# Patient Record
Sex: Female | Born: 1960 | Race: White | Hispanic: No | Marital: Married | Smoking: Never smoker
Health system: Southern US, Community
[De-identification: ages and names within clinical notes are randomized; demographics above are authoritative.]

## PROBLEM LIST (undated history)

## (undated) DIAGNOSIS — M797 Fibromyalgia: Secondary | ICD-10-CM

## (undated) DIAGNOSIS — G2581 Restless legs syndrome: Secondary | ICD-10-CM

## (undated) DIAGNOSIS — K219 Gastro-esophageal reflux disease without esophagitis: Secondary | ICD-10-CM

## (undated) HISTORY — PX: INTRAUTERINE DEVICE INSERTION: SHX323

## (undated) HISTORY — PX: HAMMER TOE SURGERY: SHX385

## (undated) HISTORY — PX: KNEE ARTHROSCOPY: SHX127

## (undated) HISTORY — PX: FOOT NEUROMA SURGERY: SHX646

## (undated) HISTORY — PX: TONSILLECTOMY: SUR1361

---

## 2007-01-11 ENCOUNTER — Emergency Department: Payer: Self-pay | Admitting: Emergency Medicine

## 2007-12-29 ENCOUNTER — Ambulatory Visit: Payer: Self-pay | Admitting: Internal Medicine

## 2008-11-19 ENCOUNTER — Ambulatory Visit: Payer: Self-pay | Admitting: Family Medicine

## 2009-04-28 ENCOUNTER — Ambulatory Visit: Payer: Self-pay | Admitting: Internal Medicine

## 2013-05-04 DIAGNOSIS — N939 Abnormal uterine and vaginal bleeding, unspecified: Secondary | ICD-10-CM | POA: Insufficient documentation

## 2013-05-04 DIAGNOSIS — M797 Fibromyalgia: Secondary | ICD-10-CM | POA: Insufficient documentation

## 2013-06-29 ENCOUNTER — Inpatient Hospital Stay: Payer: Self-pay | Admitting: Internal Medicine

## 2013-06-29 LAB — BASIC METABOLIC PANEL
Anion Gap: 5 — ABNORMAL LOW (ref 7–16)
BUN: 17 mg/dL (ref 7–18)
Calcium, Total: 9 mg/dL (ref 8.5–10.1)
Creatinine: 0.99 mg/dL (ref 0.60–1.30)
EGFR (African American): >60
EGFR (Non-African Amer.): >60
Sodium: 135 mmol/L — ABNORMAL LOW (ref 136–145)

## 2013-06-29 LAB — CBC
HGB: 10.9 g/dL — ABNORMAL LOW (ref 12.0–16.0)
MCH: 27.1 pg (ref 26.0–34.0)
MCV: 83 fL (ref 80–100)
Platelet: 182 10*3/uL (ref 150–440)
RDW: 15.7 % — ABNORMAL HIGH (ref 11.5–14.5)
WBC: 14.3 10*3/uL — ABNORMAL HIGH (ref 3.6–11.0)

## 2013-06-29 LAB — CK TOTAL AND CKMB (NOT AT ARMC): CK, Total: 291 U/L — ABNORMAL HIGH (ref 21–215)

## 2013-06-30 LAB — BASIC METABOLIC PANEL
Anion Gap: 7 (ref 7–16)
Co2: 27 mmol/L (ref 21–32)
Creatinine: 0.91 mg/dL (ref 0.60–1.30)
EGFR (African American): >60
EGFR (Non-African Amer.): >60
Osmolality: 280 (ref 275–301)
Potassium: 4.2 mmol/L (ref 3.5–5.1)
Sodium: 138 mmol/L (ref 136–145)

## 2013-06-30 LAB — CBC WITH DIFFERENTIAL/PLATELET
Basophil %: 0.1 %
Eosinophil #: 0 10*3/uL (ref 0.0–0.7)
Eosinophil %: 0 %
HCT: 32.7 % — ABNORMAL LOW (ref 35.0–47.0)
HGB: 10.9 g/dL — ABNORMAL LOW (ref 12.0–16.0)
Lymphocyte #: 2 10*3/uL (ref 1.0–3.6)
MCH: 27.6 pg (ref 26.0–34.0)
MCHC: 33.4 g/dL (ref 32.0–36.0)
Monocyte #: 0.6 x10 3/mm (ref 0.2–0.9)
Monocyte %: 4.6 %
Neutrophil %: 81.2 %
RDW: 16.1 % — ABNORMAL HIGH (ref 11.5–14.5)
WBC: 14.1 10*3/uL — ABNORMAL HIGH (ref 3.6–11.0)

## 2013-07-01 LAB — CBC WITH DIFFERENTIAL/PLATELET
Bands: 7 %
HCT: 35.1 % (ref 35.0–47.0)
HGB: 11.4 g/dL — ABNORMAL LOW (ref 12.0–16.0)
Lymphocytes: 23 %
MCH: 26.9 pg (ref 26.0–34.0)
MCHC: 32.5 g/dL (ref 32.0–36.0)
Monocytes: 6 %
RBC: 4.26 10*6/uL (ref 3.80–5.20)

## 2013-07-02 DIAGNOSIS — I509 Heart failure, unspecified: Secondary | ICD-10-CM

## 2015-03-23 ENCOUNTER — Ambulatory Visit
Admit: 2015-03-23 | Disposition: A | Discharge status: Home / Self Care | Payer: Self-pay | Attending: Specialist | Admitting: Specialist

## 2015-03-23 LAB — HCG, QUANTITATIVE, PREGNANCY: BETA HCG, QUANT.: 4 m[IU]/mL

## 2015-03-24 ENCOUNTER — Ambulatory Visit
Admit: 2015-03-24 | Disposition: A | Discharge status: Home / Self Care | Payer: Self-pay | Attending: Internal Medicine | Admitting: Internal Medicine

## 2015-03-31 ENCOUNTER — Ambulatory Visit: Admit: 2015-03-31 | Disposition: A | Discharge status: Home / Self Care | Payer: Self-pay | Admitting: Specialist

## 2015-04-08 NOTE — Discharge Summary (Signed)
PATIENT NAME:  SYBOL, MORRE MR#:  [id number] DATE OF BIRTH:  September 18, 1961  DATE OF ADMISSION:  06/29/2013 DATE OF DISCHARGE:  07/03/2013  DISCHARGE DIAGNOSES:   1.  Acute respiratory failure secondary to pneumonia.  2.  Moderate obesity.  3.  Fibromyalgia.  4.HTN  DISCHARGE MEDICATIONS: Levaquin 750 mg p.o. daily, prednisone 20 mg 2 tablets for 2 days, 1 tablet daily for 2 days and then stop, ibuprofen 800 mg p.o. b.i.d., Cymbalta 20 mg p.o. b.i.d., Norco 5/325 tablet every 4 hours, metoprolol 25 mg p.o. b.i.d., omeprazole 20 mg p.o. daily, Lyrica  20 mg p.o. b.i.d., Requip  2 mg half tablet daily.   DIET:  Low-sodium diet.  Levaquin 750 mg for 6 days.   HOSPITAL COURSE: A 54 year old female patient who is mildly obese with fibromyalgia came in because of shortness of breath and cough. The patient had low grade temperature at home, oxygen saturation was 91% on room airin ER.Marland Kitchen Had fever in the ER 102.7, admitted to hospitalist service for pneumonia and acute respiratory failure. Chest x-ray showed right middle lobe pneumonia. She was started on IV Levaquin along with Solu-Medrol, nebulizers, oxygen and Tussionex for cough. The patient's blood cultures are negative to.   She was started on IV fluids also, her symptoms resolved. The patient cough got better and remained afebrile. She did have some shortness of breath. Repeat x-ray showed some fluid overload. Echocardiogram was done, which showed ejection fraction more than 55% with normal LV function. The patient IV fluids were stopped and a dose of  Lasix was given. The patient's oxygen saturation improved to 93% on room air, so we will discharge her home with antibiotics and steroids. The patient's blood pressure stayed around 150/90, 160/80, so we started the metoprolol 25 mg p.o. b.i.d. The patient advised to follow up with her physician in the [place]: More than 30 minutes.   ____________________________ Epifanio Lesches, MD sk:cc D: 07/13/2013 14:36:32 ET T: 07/13/2013 15:19:54 ET JOB#: [id number]  cc: Epifanio Lesches, MD, <Dictator> Epifanio Lesches MD ELECTRONICALLY SIGNED 07/13/2013 19:25

## 2015-04-08 NOTE — H&P (Signed)
PATIENT NAME:  Deborah Marquez, Deborah Marquez MR#:  [id number] DATE OF BIRTH:  05-Dec-1961  DATE OF ADMISSION:  06/29/2013  PRIMARY CARE PHYSICIAN:  From [place].  CHIEF COMPLAINT: Shortness of breath and cough.   HISTORY OF PRESENT ILLNESS: The patient is a 54 year old female patient with history of fibromyalgia who came in last night because of cough and shortness of breath getting progressively worse for the past 1 week. Initially started as a sore throat a week ago and then over the last week the patient developed some cough and also generalized body aches and feeling poor, and the patient's cough is mainly nonproductive. Then shortness of breath got worse yesterday.  She was seen in the Emergency Room and she was about to be discharged, but she was hypoxic on room air, sats were only 91% on room, so she was being admitted for pneumonia and respiratory failure. The was also noted to have low-grade fever at home. No nausea. No vomiting. No diarrhea. No abdominal pain. No sick contact. No recent travel. The patient felt lightheaded yesterday.  No extremity edema. No orthopnea or PND.  In the ER, the patient's maximum fever was 102.7.  PAST MEDICAL HISTORY:  Significant for history of fibromyalgia.   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   ALLERGIES: PENICILLIN, SULFA AND ASPIRIN.   SOCIAL HISTORY: No smoking.  No alcohol. No drugs.   FAMILY HISTORY: No hypertension or diabetes.   MEDICATIONS:  1.  Albuterol that was given last night.  2.  Flexeril 10 mg p.o. as needed. 3.  Cymbalta 20 mg delayed release capsule 2 capsules daily. 4.  Ibuprofen 800 mg p.o. b.i.d.  5.  Lyrica 25 mg p.o. b.i.d.  6.  Omeprazole 20 mg p.o. daily.  7.  Requip 2 mg 1/2 tablets once a day.   REVIEW OF SYSTEMS:    CONSTITUTIONAL: Has fever, fatigue and weakness. EYES: No blurred vision.  ENT: No tinnitus. No epistaxis. The patient had some  sore throat and difficulty swallowing in earlier part of the week. RESPIRATORY:  Does have some cough, unable to get the phlegm out. Also has trouble breathing for almost 3 to 4 days, getting worse.  CARDIOVASCULAR: No chest pain. No orthopnea. No PND.  No pedal edema.  GASTROINTESTINAL: Has no nausea, no vomiting, no abdominal pain. The patient has poor appetite.  GENITOURINARY: No dysuria.  ENDOCRINE: No polyuria or nocturia.  HEMATOLOGIC: No anemia or easy bruising.  INTEGUMENTARY: No skin rashes.  MUSCULOSKELETAL: Has fibromyalgia, but denies any acute joint pains at this time.  NEUROLOGIC: No numbness. No weakness. No dysarthria. The patient does have some headache because of the cough.  PSYCHIATRIC: No anxiety. Has insomnia.   PHYSICAL EXAMINATION: VITAL SIGNS: Temperature 98.8, heart rate is around 120s to 130s. During my exam it is around 110.  Blood pressure 110/76 and O2 sats 91% on room air. GENERAL: An alert, awake, oriented and mildly obese female answering questions appropriately.  The patient is not in distress, but is coughing, having very dry cough. HEAD:  Normocephalic, atraumatic.  EYES:  PERRLA. EOMs intact. No conjunctival pallor. No scleral icterus.  NOSE: No nasal lesions. No drainage.  EARS: No tympanic membrane congestion. No external lesions.  MOUTH:  Has no lesions. No exudates. Tonsils are slightly enlarged.  NECK: Supple, symmetric. No masses. Thyroid is in the midline.  LUNGS:  The patient has good respiratory effort.  Mostly clear to  auscultation with slight crackles at the left base.  HEART:  S1 and S2 regular. No murmurs. No gallops. PMI not displaced. Pulses are equal, carotid, femoral and pedal.  ABDOMEN:  Soft, nontender and nondistended. No hepatosplenomegaly. No CVA tenderness.  MUSCULOSKELETAL: The patient has no pathology at the digits or hips.  Normal range of motion. Strength and tone are equal bilaterally.  SKIN: Normal, well-hydrated. No  diaphoresis. LYMPH:  No cervical lymph nodes, no lymphadenopathy.  NEUROLOGIC: Cranial nerves II through XII are intact. DTRs are 2+ bilaterally. Motor exam is normal with strength 4 x 4 in bilateral upper and lower extremities. PSYCHIATRIC: Judgment and insight are adequate, alert and oriented x 3.   LABORATORY AND DIAGNOSTICS: Chest x-ray showed focal heterogenous opacity in the middle of right lower lobe. Maybe due to pneumonia.   WBC 14.3, hemoglobin 10.9, hematocrit 33.5 and platelets 182. Electrolytes:  Sodium 135, potassium 3.5, chloride 102, bicarb 28, BUN 17, creatinine 0.99 and glucose 120. Troponin less than 0.02. CK total 291.   EKG showed sinus tachycardia at 116 beats per minute. No ST-T changes.   ASSESSMENT AND PLAN:   1.  The patient is a 54 year old female patient with respiratory failure secondary to pneumonia. The patient's O2 sats are around 91 on room air and 95% on 2 liters. Admit to hospitalist service. Continue oxygen, 2 liters, to keep sats more than 95. Continue Levaquin and also Solu-Medrol. Add nebulizer every 4 hours p.r.n. The patient will be monitored for temperature.  Sputum and blood cultures are already sent so please follow. Add Mucinex for mucolytic action.  2.  Fibromyalgia. She is on Cymbalta and Requip.  Continue those medications.  3.  Tachycardia secondary to respiratory status. She was started on small dose beta blocker and also we are going to start fluids.  4.  Gastrointestinal prophylaxis and deep vein thrombosis prophylaxis.   I discussed the plan with the patient.  TIME SPENT: About  60 minutes on this patient.   ____________________________ Epifanio Lesches, MD sk:sb D: 06/29/2013 09:52:05 ET T: 06/29/2013 10:06:56 ET JOB#: [id number]  cc: Epifanio Lesches, MD, <Dictator> Epifanio Lesches MD ELECTRONICALLY SIGNED 06/29/2013 20:21

## 2015-04-20 ENCOUNTER — Ambulatory Visit: Payer: BLUE CROSS/BLUE SHIELD | Attending: Otolaryngology

## 2015-04-20 DIAGNOSIS — F5101 Primary insomnia: Secondary | ICD-10-CM | POA: Diagnosis not present

## 2015-04-20 DIAGNOSIS — I4891 Unspecified atrial fibrillation: Secondary | ICD-10-CM | POA: Diagnosis present

## 2015-04-20 DIAGNOSIS — G4733 Obstructive sleep apnea (adult) (pediatric): Secondary | ICD-10-CM | POA: Diagnosis not present

## 2015-08-16 ENCOUNTER — Encounter
Admission: RE | Admit: 2015-08-16 | Discharge: 2015-08-16 | Disposition: A | Discharge status: Home / Self Care | Payer: BLUE CROSS/BLUE SHIELD | Source: Ambulatory Visit | Attending: Specialist | Admitting: Specialist

## 2015-08-16 DIAGNOSIS — Z01818 Encounter for other preprocedural examination: Secondary | ICD-10-CM | POA: Diagnosis present

## 2015-08-16 HISTORY — DX: Fibromyalgia: M79.7

## 2015-08-16 HISTORY — DX: Gastro-esophageal reflux disease without esophagitis: K21.9

## 2015-08-16 LAB — BASIC METABOLIC PANEL
Anion gap: 6 (ref 5–15)
BUN: 20 mg/dL (ref 6–20)
CO2: 31 mmol/L (ref 22–32)
Calcium: 9.3 mg/dL (ref 8.9–10.3)
Chloride: 103 mmol/L (ref 101–111)
Creatinine, Ser: 0.68 mg/dL (ref 0.44–1.00)
GFR calc Af Amer: >60 mL/min (ref >60)
GFR calc non Af Amer: >60 mL/min (ref >60)
Glucose, Bld: 91 mg/dL (ref 65–99)
Potassium: 4.3 mmol/L (ref 3.5–5.1)
Sodium: 140 mmol/L (ref 135–145)

## 2015-08-16 LAB — TYPE AND SCREEN
ABO/RH(D): O NEG
Antibody Screen: NEGATIVE

## 2015-08-16 LAB — ABO/RH: ABO/RH(D): O NEG

## 2015-08-16 NOTE — Patient Instructions (Addendum)
  Your procedure is scheduled on: Tuesday 08/23/2015 Report to Day Surgery. 2ND FLOOR MEDICAL MALL ENTRANCE To find out your arrival time please call [phone] between 1PM - 3PM on Friday 08/19/2015.  Remember: Instructions that are not followed completely may result in serious medical risk, up to and including death, or upon the discretion of your surgeon and anesthesiologist your surgery may need to be rescheduled.    __X__ 1. Do not eat food or drink liquids after midnight. No gum chewing or hard candies.     __X__ 2. No Alcohol for 24 hours before or after surgery.   ____ 3. Bring all medications with you on the day of surgery if instructed.    __X__ 4. Notify your doctor if there is any change in your medical condition     (cold, fever, infections).     Do not wear jewelry, make-up, hairpins, clips or nail polish.  Do not wear lotions, powders, or perfumes.   Do not shave 48 hours prior to surgery. Men may shave face and neck.  Do not bring valuables to the hospital.    [facility] is not responsible for any belongings or valuables.               Contacts, dentures or bridgework may not be worn into surgery.  Leave your suitcase in the car. After surgery it may be brought to your room.  For patients admitted to the hospital, discharge time is determined by your                treatment team.   Patients discharged the day of surgery will not be allowed to drive home.   Please read over the following fact sheets that you were given:   Surgical Site Infection Prevention   __X__ Take these medicines the morning of surgery with A SIP OF WATER:    1. CYMBALTA  2. LYRICA  3. OMEPRAZOLE  4.  5.  6.  ____ Fleet Enema (as directed)   __X__ Use CHG Soap as directed  ____ Use inhalers on the day of surgery  ____ Stop metformin 2 days prior to surgery    ____ Take 1/2 of usual insulin dose the night before surgery and none on the morning of surgery.   ____ Stop  Coumadin/Plavix/aspirin on   __X__ Stop Anti-inflammatories on TODAY 08/16/2015   ____ Stop supplements until after surgery.    __X__ Bring C-Pap to the hospital.

## 2015-08-23 ENCOUNTER — Inpatient Hospital Stay: Payer: BLUE CROSS/BLUE SHIELD | Admitting: Anesthesiology

## 2015-08-23 ENCOUNTER — Encounter: Payer: Self-pay | Admitting: Anesthesiology

## 2015-08-23 ENCOUNTER — Encounter
Admission: RE | Disposition: A | Discharge status: Home / Self Care | Payer: Self-pay | Source: Ambulatory Visit | Attending: Specialist

## 2015-08-23 ENCOUNTER — Inpatient Hospital Stay
Admission: RE | Admit: 2015-08-23 | Discharge: 2015-08-24 | DRG: 620 | Disposition: A | Discharge status: Home / Self Care | Payer: BLUE CROSS/BLUE SHIELD | Source: Ambulatory Visit | Attending: Specialist | Admitting: Specialist

## 2015-08-23 DIAGNOSIS — Z8701 Personal history of pneumonia (recurrent): Secondary | ICD-10-CM | POA: Diagnosis not present

## 2015-08-23 DIAGNOSIS — M940 Chondrocostal junction syndrome [Tietze]: Secondary | ICD-10-CM | POA: Diagnosis present

## 2015-08-23 DIAGNOSIS — M159 Polyosteoarthritis, unspecified: Secondary | ICD-10-CM | POA: Diagnosis present

## 2015-08-23 DIAGNOSIS — G2581 Restless legs syndrome: Secondary | ICD-10-CM | POA: Diagnosis present

## 2015-08-23 DIAGNOSIS — Z6841 Body Mass Index (BMI) 40.0 and over, adult: Secondary | ICD-10-CM

## 2015-08-23 DIAGNOSIS — K9589 Other complications of other bariatric procedure: Secondary | ICD-10-CM | POA: Diagnosis not present

## 2015-08-23 DIAGNOSIS — G43909 Migraine, unspecified, not intractable, without status migrainosus: Secondary | ICD-10-CM | POA: Diagnosis present

## 2015-08-23 DIAGNOSIS — M797 Fibromyalgia: Secondary | ICD-10-CM | POA: Diagnosis present

## 2015-08-23 DIAGNOSIS — Z880 Allergy status to penicillin: Secondary | ICD-10-CM | POA: Diagnosis not present

## 2015-08-23 DIAGNOSIS — Z79891 Long term (current) use of opiate analgesic: Secondary | ICD-10-CM

## 2015-08-23 DIAGNOSIS — Z791 Long term (current) use of non-steroidal anti-inflammatories (NSAID): Secondary | ICD-10-CM | POA: Diagnosis not present

## 2015-08-23 DIAGNOSIS — K219 Gastro-esophageal reflux disease without esophagitis: Secondary | ICD-10-CM | POA: Diagnosis present

## 2015-08-23 DIAGNOSIS — K449 Diaphragmatic hernia without obstruction or gangrene: Secondary | ICD-10-CM | POA: Diagnosis present

## 2015-08-23 DIAGNOSIS — Z8262 Family history of osteoporosis: Secondary | ICD-10-CM | POA: Diagnosis not present

## 2015-08-23 DIAGNOSIS — Z882 Allergy status to sulfonamides status: Secondary | ICD-10-CM

## 2015-08-23 DIAGNOSIS — I7300 Raynaud's syndrome without gangrene: Secondary | ICD-10-CM | POA: Diagnosis present

## 2015-08-23 DIAGNOSIS — Z8249 Family history of ischemic heart disease and other diseases of the circulatory system: Secondary | ICD-10-CM | POA: Diagnosis not present

## 2015-08-23 DIAGNOSIS — G4733 Obstructive sleep apnea (adult) (pediatric): Secondary | ICD-10-CM | POA: Diagnosis present

## 2015-08-23 DIAGNOSIS — J309 Allergic rhinitis, unspecified: Secondary | ICD-10-CM | POA: Diagnosis present

## 2015-08-23 DIAGNOSIS — Z780 Asymptomatic menopausal state: Secondary | ICD-10-CM

## 2015-08-23 DIAGNOSIS — Z833 Family history of diabetes mellitus: Secondary | ICD-10-CM | POA: Diagnosis not present

## 2015-08-23 DIAGNOSIS — K9171 Accidental puncture and laceration of a digestive system organ or structure during a digestive system procedure: Secondary | ICD-10-CM | POA: Diagnosis not present

## 2015-08-23 DIAGNOSIS — Z79899 Other long term (current) drug therapy: Secondary | ICD-10-CM | POA: Diagnosis not present

## 2015-08-23 HISTORY — PX: LAPAROSCOPIC GASTRIC RESTRICTIVE DUODENAL PROCEDURE (DUODENAL SWITCH): SHX6667

## 2015-08-23 LAB — CBC
HCT: 40.1 % (ref 35.0–47.0)
HEMOGLOBIN: 13 g/dL (ref 12.0–16.0)
MCH: 28 pg (ref 26.0–34.0)
MCHC: 32.5 g/dL (ref 32.0–36.0)
MCV: 86.1 fL (ref 80.0–100.0)
Platelets: 56 10*3/uL — ABNORMAL LOW (ref 150–440)
RBC: 4.66 MIL/uL (ref 3.80–5.20)
RDW: 15.2 % — ABNORMAL HIGH (ref 11.5–14.5)
WBC: 12.8 10*3/uL — AB (ref 3.6–11.0)

## 2015-08-23 LAB — CREATININE, SERUM: CREATININE: 0.75 mg/dL (ref 0.44–1.00)

## 2015-08-23 SURGERY — LAPAROSCOPIC GASTRIC RESTRICTIVE DUODENAL PROCEDURE (DUODENAL SWITCH)
Anesthesia: General | Wound class: Clean Contaminated

## 2015-08-23 MED ORDER — OXYCODONE HCL 5 MG/5ML PO SOLN: 5.0000 mg | Freq: Once | ORAL | Status: DC | PRN

## 2015-08-23 MED ORDER — SUGAMMADEX SODIUM 500 MG/5ML IV SOLN
INTRAVENOUS | Status: DC | PRN
  Administered 2015-08-23: 200 mg via INTRAVENOUS

## 2015-08-23 MED ORDER — SCOPOLAMINE 1 MG/3DAYS TD PT72
1.0000 | MEDICATED_PATCH | TRANSDERMAL | Status: DC
  Administered 2015-08-23: 1.5 mg via TRANSDERMAL

## 2015-08-23 MED ORDER — OXYCODONE HCL 5 MG PO TABS: 5.0000 mg | ORAL_TABLET | Freq: Once | ORAL | Status: DC | PRN

## 2015-08-23 MED ORDER — ACETAMINOPHEN 10 MG/ML IV SOLN
INTRAVENOUS | Status: AC
  Administered 2015-08-23: 1000 mg via INTRAVENOUS
  Filled 2015-08-23: qty 100

## 2015-08-23 MED ORDER — ONDANSETRON HCL 4 MG/2ML IJ SOLN
INTRAMUSCULAR | Status: DC | PRN
  Administered 2015-08-23: 4 mg via INTRAVENOUS

## 2015-08-23 MED ORDER — DEXAMETHASONE SODIUM PHOSPHATE 4 MG/ML IJ SOLN
INTRAMUSCULAR | Status: DC | PRN
  Administered 2015-08-23: 5 mg via INTRAVENOUS

## 2015-08-23 MED ORDER — PROPOFOL 10 MG/ML IV BOLUS
INTRAVENOUS | Status: DC | PRN
  Administered 2015-08-23: 200 mg via INTRAVENOUS

## 2015-08-23 MED ORDER — CLINDAMYCIN PHOSPHATE 600 MG/50ML IV SOLN
600.0000 mg | Freq: Three times a day (TID) | INTRAVENOUS | Status: AC
  Administered 2015-08-23 – 2015-08-24 (×2): 600 mg via INTRAVENOUS
  Filled 2015-08-23 (×2): qty 50

## 2015-08-23 MED ORDER — FENTANYL CITRATE (PF) 100 MCG/2ML IJ SOLN
INTRAMUSCULAR | Status: AC
  Filled 2015-08-23: qty 2

## 2015-08-23 MED ORDER — LIDOCAINE HCL (CARDIAC) 20 MG/ML IV SOLN
INTRAVENOUS | Status: DC | PRN
  Administered 2015-08-23: 100 mg via INTRAVENOUS

## 2015-08-23 MED ORDER — ENOXAPARIN SODIUM 40 MG/0.4ML ~~LOC~~ SOLN
40.0000 mg | Freq: Two times a day (BID) | SUBCUTANEOUS | Status: DC
  Administered 2015-08-24: 40 mg via SUBCUTANEOUS
  Filled 2015-08-23: qty 0.4

## 2015-08-23 MED ORDER — FENTANYL CITRATE (PF) 100 MCG/2ML IJ SOLN
INTRAMUSCULAR | Status: DC | PRN
  Administered 2015-08-23 (×2): 50 ug via INTRAVENOUS
  Administered 2015-08-23: 250 ug via INTRAVENOUS
  Administered 2015-08-23: 100 ug via INTRAVENOUS
  Administered 2015-08-23: 50 ug via INTRAVENOUS

## 2015-08-23 MED ORDER — INFLUENZA VAC SPLIT QUAD 0.5 ML IM SUSY
0.5000 mL | PREFILLED_SYRINGE | INTRAMUSCULAR | Status: AC
  Administered 2015-08-24: 0.5 mL via INTRAMUSCULAR
  Filled 2015-08-23: qty 0.5

## 2015-08-23 MED ORDER — FENTANYL CITRATE (PF) 100 MCG/2ML IJ SOLN
25.0000 ug | INTRAMUSCULAR | Status: AC | PRN
  Administered 2015-08-23 (×6): 25 ug via INTRAVENOUS

## 2015-08-23 MED ORDER — THROMBIN 5000 UNITS EX SOLR
CUTANEOUS | Status: AC
  Filled 2015-08-23: qty 5000

## 2015-08-23 MED ORDER — ONDANSETRON HCL 4 MG/2ML IJ SOLN
4.0000 mg | INTRAMUSCULAR | Status: DC | PRN
  Administered 2015-08-23 – 2015-08-24 (×2): 4 mg via INTRAVENOUS
  Filled 2015-08-23 (×2): qty 2

## 2015-08-23 MED ORDER — MORPHINE SULFATE (PF) 2 MG/ML IV SOLN
2.0000 mg | INTRAVENOUS | Status: DC | PRN
  Administered 2015-08-23: 6 mg via INTRAVENOUS
  Administered 2015-08-23: 4 mg via INTRAVENOUS
  Filled 2015-08-23: qty 3
  Filled 2015-08-23: qty 2

## 2015-08-23 MED ORDER — SCOPOLAMINE 1 MG/3DAYS TD PT72
MEDICATED_PATCH | TRANSDERMAL | Status: AC
  Administered 2015-08-23: 1.5 mg via TRANSDERMAL
  Filled 2015-08-23: qty 1

## 2015-08-23 MED ORDER — OXYCODONE HCL 5 MG/5ML PO SOLN: 5.0000 mg | ORAL | Status: DC | PRN

## 2015-08-23 MED ORDER — THROMBIN 5000 UNITS EX SOLR
CUTANEOUS | Status: DC | PRN
  Administered 2015-08-23: 5000 [IU] via TOPICAL

## 2015-08-23 MED ORDER — SODIUM CHLORIDE 0.9 % IV SOLN
INTRAVENOUS | Status: DC
  Administered 2015-08-23 – 2015-08-24 (×2): via INTRAVENOUS

## 2015-08-23 MED ORDER — MIDAZOLAM HCL 2 MG/2ML IJ SOLN
INTRAMUSCULAR | Status: DC | PRN
  Administered 2015-08-23: 2 mg via INTRAVENOUS

## 2015-08-23 MED ORDER — CLINDAMYCIN PHOSPHATE 900 MG/50ML IV SOLN
INTRAVENOUS | Status: AC
  Administered 2015-08-23: 900 mg via INTRAVENOUS
  Filled 2015-08-23: qty 50

## 2015-08-23 MED ORDER — PHENYLEPHRINE HCL 10 MG/ML IJ SOLN
INTRAMUSCULAR | Status: DC | PRN
  Administered 2015-08-23 (×2): 200 ug via INTRAVENOUS

## 2015-08-23 MED ORDER — SUCCINYLCHOLINE CHLORIDE 20 MG/ML IJ SOLN
INTRAMUSCULAR | Status: DC | PRN
  Administered 2015-08-23: 120 mg via INTRAVENOUS

## 2015-08-23 MED ORDER — GENTAMICIN SULFATE 40 MG/ML IJ SOLN
1.5000 mg/kg | Freq: Once | INTRAMUSCULAR | Status: DC
  Filled 2015-08-23: qty 5.75

## 2015-08-23 MED ORDER — ACETAMINOPHEN 160 MG/5ML PO SOLN
325.0000 mg | ORAL | Status: DC | PRN
Start: 2015-08-24 — End: 2015-08-24
  Filled 2015-08-23: qty 20.3

## 2015-08-23 MED ORDER — ENOXAPARIN SODIUM 40 MG/0.4ML ~~LOC~~ SOLN: 40.0000 mg | Freq: Two times a day (BID) | SUBCUTANEOUS | Status: DC

## 2015-08-23 MED ORDER — SODIUM CHLORIDE 0.9 % IR SOLN
Status: DC | PRN
  Administered 2015-08-23: 200 mL

## 2015-08-23 MED ORDER — ACETAMINOPHEN 10 MG/ML IV SOLN
1000.0000 mg | Freq: Four times a day (QID) | INTRAVENOUS | Status: DC
  Administered 2015-08-23 – 2015-08-24 (×3): 1000 mg via INTRAVENOUS
  Filled 2015-08-23 (×4): qty 100

## 2015-08-23 MED ORDER — HYDROCODONE-ACETAMINOPHEN 7.5-325 MG/15ML PO SOLN: 15.0000 mL | Freq: Four times a day (QID) | ORAL | Status: AC | PRN

## 2015-08-23 MED ORDER — ACETAMINOPHEN 10 MG/ML IV SOLN: 1000.0000 mg | Freq: Once | INTRAVENOUS | Status: DC

## 2015-08-23 MED ORDER — BUPIVACAINE-EPINEPHRINE (PF) 0.5% -1:200000 IJ SOLN
INTRAMUSCULAR | Status: AC
  Filled 2015-08-23: qty 30

## 2015-08-23 MED ORDER — LACTATED RINGERS IV SOLN
INTRAVENOUS | Status: DC
  Administered 2015-08-23 (×2): via INTRAVENOUS

## 2015-08-23 MED ORDER — BUPIVACAINE-EPINEPHRINE (PF) 0.5% -1:200000 IJ SOLN
INTRAMUSCULAR | Status: DC | PRN
  Administered 2015-08-23: 25 mL

## 2015-08-23 MED ORDER — LIDOCAINE-EPINEPHRINE (PF) 1 %-1:200000 IJ SOLN
INTRAMUSCULAR | Status: AC
  Filled 2015-08-23: qty 30

## 2015-08-23 MED ORDER — LACTATED RINGERS IV SOLN
INTRAVENOUS | Status: DC
Start: 2015-08-23 — End: 2015-08-23
  Administered 2015-08-23: 13:00:00 via INTRAVENOUS

## 2015-08-23 MED ORDER — PANTOPRAZOLE SODIUM 40 MG IV SOLR
40.0000 mg | Freq: Every day | INTRAVENOUS | Status: DC
  Administered 2015-08-23: 40 mg via INTRAVENOUS
  Filled 2015-08-23: qty 40

## 2015-08-23 MED ORDER — CLINDAMYCIN PHOSPHATE 900 MG/50ML IV SOLN
900.0000 mg | Freq: Once | INTRAVENOUS | Status: AC
  Administered 2015-08-23: 900 mg via INTRAVENOUS

## 2015-08-23 MED ORDER — ROCURONIUM BROMIDE 100 MG/10ML IV SOLN
INTRAVENOUS | Status: DC | PRN
  Administered 2015-08-23: 20 mg via INTRAVENOUS
  Administered 2015-08-23: 10 mg via INTRAVENOUS
  Administered 2015-08-23: 50 mg via INTRAVENOUS

## 2015-08-23 MED ORDER — LIDOCAINE-EPINEPHRINE (PF) 1 %-1:200000 IJ SOLN
INTRAMUSCULAR | Status: DC | PRN
  Administered 2015-08-23: 15 mL

## 2015-08-23 MED ORDER — ONDANSETRON HCL 4 MG PO TABS: 4.0000 mg | ORAL_TABLET | Freq: Four times a day (QID) | ORAL | Status: DC | PRN

## 2015-08-23 SURGICAL SUPPLY — 68 items
APPLICATOR SURGIFLO (MISCELLANEOUS) ×4 IMPLANT
APPLIER CLIP ROT 13.4 12 LRG (CLIP)
BANDAGE ELASTIC 6 CLIP NS LF (GAUZE/BANDAGES/DRESSINGS) ×8 IMPLANT
BLADE SURG SZ11 CARB STEEL (BLADE) ×4 IMPLANT
BULB RESERV EVAC DRAIN JP 100C (MISCELLANEOUS) ×4 IMPLANT
CANISTER SUCT 1200ML W/VALVE (MISCELLANEOUS) ×4 IMPLANT
CHLORAPREP W/TINT 26ML (MISCELLANEOUS) ×8 IMPLANT
CLIP APPLIE ROT 13.4 12 LRG (CLIP) IMPLANT
DEFOGGER SCOPE WARMER CLEARIFY (MISCELLANEOUS) ×4 IMPLANT
DRAIN CHANNEL JP 19F (MISCELLANEOUS) ×4 IMPLANT
DRAPE UTILITY 15X26 TOWEL STRL (DRAPES) ×8 IMPLANT
DRSG TEGADERM 4X4.75 (GAUZE/BANDAGES/DRESSINGS) ×4 IMPLANT
FILTER LAP SMOKE EVAC STRL (MISCELLANEOUS) ×4 IMPLANT
GLOVE BIO SURGEON STRL SZ 6.5 (GLOVE) ×6 IMPLANT
GLOVE BIO SURGEON STRL SZ8 (GLOVE) IMPLANT
GLOVE BIO SURGEONS STRL SZ 6.5 (GLOVE) ×2
GLOVE SKINSENSE NS SZ7.5 (GLOVE) ×18
GLOVE SKINSENSE STRL SZ7.5 (GLOVE) ×18 IMPLANT
GOWN STRL REUS W/ TWL LRG LVL3 (GOWN DISPOSABLE) ×10 IMPLANT
GOWN STRL REUS W/ TWL XL LVL3 (GOWN DISPOSABLE) ×2 IMPLANT
GOWN STRL REUS W/TWL LRG LVL3 (GOWN DISPOSABLE) ×10
GOWN STRL REUS W/TWL XL LVL3 (GOWN DISPOSABLE) ×2
HEMOSTAT SURGICEL 2X3 (HEMOSTASIS) ×4 IMPLANT
IRRIGATION STRYKERFLOW (MISCELLANEOUS) ×2 IMPLANT
IRRIGATOR STRYKERFLOW (MISCELLANEOUS) ×4
IV NS 1000ML (IV SOLUTION) ×2
IV NS 1000ML BAXH (IV SOLUTION) ×2 IMPLANT
KIT RM TURNOVER STRD PROC AR (KITS) ×4 IMPLANT
LABEL OR SOLS (LABEL) ×4 IMPLANT
LIQUID BAND (GAUZE/BANDAGES/DRESSINGS) ×4 IMPLANT
NDL INSUFF 14G 150MM VS150000 (NEEDLE) ×4 IMPLANT
NDL SAFETY 22GX1.5 (NEEDLE) ×4 IMPLANT
NS IRRIG 1000ML POUR BTL (IV SOLUTION) ×4 IMPLANT
PACK LAP CHOLECYSTECTOMY (MISCELLANEOUS) ×4 IMPLANT
RELOAD STAPLER BLUE 60MM (STAPLE) ×2 IMPLANT
RELOAD STAPLER GOLD 60MM (STAPLE) ×10 IMPLANT
RELOAD STAPLER GREEN 60MM (STAPLE) ×2 IMPLANT
RELOAD STAPLER WHITE 60MM (STAPLE) ×4 IMPLANT
SHEARS HARMONIC ACE PLUS 45CM (MISCELLANEOUS) ×4 IMPLANT
SLEEVE ENDOPATH XCEL 5M (ENDOMECHANICALS) ×8 IMPLANT
SLEEVE GASTRECTOMY 40FR VISIGI (MISCELLANEOUS) ×4 IMPLANT
SPOGE SURGIFLO 8M (HEMOSTASIS) ×2
SPONGE DRAIN TRACH 4X4 STRL 2S (GAUZE/BANDAGES/DRESSINGS) ×4 IMPLANT
SPONGE SURGIFLO 8M (HEMOSTASIS) ×2 IMPLANT
STAPLER ECHELON BIOABSB 60 FLE (MISCELLANEOUS) ×28 IMPLANT
STAPLER ECHELON LONG 60 440 (INSTRUMENTS) ×4 IMPLANT
STAPLER RELOAD BLUE 60MM (STAPLE) ×4
STAPLER RELOAD GOLD 60MM (STAPLE) ×20
STAPLER RELOAD GREEN 60MM (STAPLE) ×4
STAPLER RELOAD WHITE 60MM (STAPLE) ×8
SUT DEVICE BRAIDED 0X39 (SUTURE) ×4 IMPLANT
SUT DEVICE BRAIDED 2.0X39 (SUTURE) ×4 IMPLANT
SUT DVC ABSORB BRAID 3.0X39 (SUTURE) ×16 IMPLANT
SUT DVC VICRYL PGA 2.0X39 (SUTURE) ×4 IMPLANT
SUT ETHILON 2 0 FS 18 (SUTURE) ×4 IMPLANT
SUT MNCRL 4-0 (SUTURE) ×4
SUT MNCRL 4-0 27XMFL (SUTURE) ×4
SUT VIC AB 4-0 FS2 27 (SUTURE) ×8 IMPLANT
SUT VICRYL/POLYSORB 3.0 (SUTURE) ×12 IMPLANT
SUTURE MNCRL 4-0 27XMF (SUTURE) ×4 IMPLANT
SYR 20CC LL (SYRINGE) ×4 IMPLANT
TROCAR BLADELESS 15MM (ENDOMECHANICALS) ×4 IMPLANT
TROCAR SL VERSASTEP 5M LG  B (MISCELLANEOUS) ×2
TROCAR SL VERSASTEP 5M LG B (MISCELLANEOUS) ×2 IMPLANT
TROCAR XCEL 12X100 BLDLESS (ENDOMECHANICALS) ×4 IMPLANT
TROCAR XCEL NON-BLD 5MMX100MML (ENDOMECHANICALS) ×4 IMPLANT
TUBING INSUFFLATOR HEATED (MISCELLANEOUS) ×4 IMPLANT
WATER STERILE IRR 1000ML POUR (IV SOLUTION) ×4 IMPLANT

## 2015-08-23 NOTE — Op Note (Signed)
Preoperative diagnosis: Morbid obesity; GERD Postoperative diagnosis: Same; hiatal hernia Procedure: Laparoscopic bilo pancreatic diversion with duodenal switch and hiatal hernia repair; double anastomosis Surgeon: Darnell Level Assistant: Lorenda Cahill Complications: Small grade 1 liver laceration the anterior portion of the lateral lobe of the liver less than 1 cm Specimens: Portion of stomach Anesthesia: Gen. endotracheal Drains: 19 French Blake right upper quadrant left upper quadrant Distance from pylorus; 6 cm Alimentary channel: 300 cm Common channel: 250 cm Bougie size: 40 [place]  Details of procedure: The patient was taken the operating room placed on the operating table in supine position. Appropriate monitors submental action were delivered. Broad-spectrum IV antibiotics were given. Patient was placed under general anesthesia without incident. A 40 French bougie was placed in the stomach without incident. Sequential stockings were placed. Timeout was performed.  The abdomen was prepped and draped in usual sterile fashion. It was accessed using a 5 mm optical trocar in the left upper outer quadrant. Pneumoperitoneum established without difficulty. Multiple other trochars were placed. A liver retractor was placed. Small bowel was measured from the ileocecal valve 300 cm and marked appropriately and tacked to the right upper quadrant omentum. A liver retractor is placed and the patient is placed in steep reverse Trendelenburg position. The greater curvature of the stomach was then mobilized using Harmonic scalpel taking down the omental attachments and and the short gastrics as well as the attachments left hemidiaphragm and posteriorly. This freed up the entire lateral portion of the stomach. Dissection continued onto the duodenum and to the juncture of the pancreas and duodenum. This was all done with Harmonic scalpel. Minimal supraduodenal dissection was performed so as not to devascularize the duodenal  stump. The 40 French bougie was passed down into the antrum and the antrum was bisected using an echelon green load stapler reinforced with SEAMGUARD. Multiple gold load with SEAMGUARD were then placed through the 12 mm port in EPAP parallel to the lesser curvature. The extra stomach was then removed through the 15 mm port without spillage.  The duodenum was then divided after repositioning of the liver retractor. At some point we noticed a small amount of oozing coming from the anterior portion of the liver and there was a small tiny 1 cm liver laceration grade 1 barely through the capsule that was oozing. We placed a sponge on it but he continued is. We subsequently used FloSeal and that premarked stopped it. We decided to leave a drain at the end of the case just keep an eye on things to make sure she doesn't bleed.  The duodenum was divided with a gold load with SEAMGUARD without incident. Again minimal suprapubic duodenal dissection occurred and the small bowel at 300 cm was mobilized to the duodenum by taking down the tacking suture and the 2 were reapproximated using running 2-0 Polysorb suture. Enterotomies were made in both limbs and a side-to-side anastomosis was created using running 3-0 Polysorb suture of multiple lengths. The anastomosis was checked for leak by clamping the biliopancreatic limb and the alimentary channel simultaneously and insufflating through the bougie. This was done under saline and no leak was detected. The biliopancreatic limb was then divided using a white load with SEAMGUARD 60 mm fire. It was translocated 50 cm downstream in a side-to-side anastomosis type fashion. Enterotomies were made in both after stay sutures had been placed and a white load was 60 mm was inserted into the lumen of both and fired. The resulting defect was reapproximated using interrupted 2 Surgidek  suture and then closed using a blue load 60 mm stapler. Excess tissue was removed and discarded. The  mesenteric defect was closed using running 20 Surgidek suture.  A drain was then placed in the right upper quadrant and left upper quadrant coming from the left upper quadrant and secured using a 3-0 nylon suture. All the trochars and liver retractor removed. No further bleeding occurred from liver site. The wounds closed using 4-0 Vicryl and Dermabond. The patient tolerated the procedure well and arrived in recovery in stable condition.

## 2015-08-23 NOTE — Anesthesia Preprocedure Evaluation (Signed)
Anesthesia Evaluation  Patient identified by MRN, date of birth, ID band Patient awake    Reviewed: Allergy & Precautions, H&P , NPO status , Patient's Chart, lab work & pertinent test results  History of Anesthesia Complications (+) DIFFICULT IV STICK / SPECIAL LINE and history of anesthetic complications  Airway Mallampati: III  TM Distance: >3 FB Neck ROM: full    Dental no notable dental hx. (+) Teeth Intact   Pulmonary neg pulmonary ROS,  breath sounds clear to auscultation  Pulmonary exam normal       Cardiovascular Exercise Tolerance: Good - Past MI negative cardio ROS Normal cardiovascular examRhythm:regular Rate:Normal     Neuro/Psych  Neuromuscular disease negative psych ROS   GI/Hepatic Neg liver ROS, GERD-  Controlled,  Endo/Other  negative endocrine ROS  Renal/GU negative Renal ROS  negative genitourinary   Musculoskeletal  (+) Fibromyalgia -  Abdominal   Peds  Hematology negative hematology ROS (+)   Anesthesia Other Findings Past Medical History:   Fibromyalgia                                                 GERD (gastroesophageal reflux disease)                       Super Morbid Obesity  Reproductive/Obstetrics negative OB ROS                             Anesthesia Physical Anesthesia Plan  ASA: III  Anesthesia Plan: General ETT   Post-op Pain Management:    Induction:   Airway Management Planned:   Additional Equipment:   Intra-op Plan:   Post-operative Plan:   Informed Consent: I have reviewed the patients History and Physical, chart, labs and discussed the procedure including the risks, benefits and alternatives for the proposed anesthesia with the patient or authorized representative who has indicated his/her understanding and acceptance.   Dental Advisory Given  Plan Discussed with: Anesthesiologist, CRNA and Surgeon  Anesthesia Plan Comments:          Anesthesia Quick Evaluation

## 2015-08-23 NOTE — Anesthesia Procedure Notes (Signed)
Procedure Name: Intubation Date/Time: 08/23/2015 1:45 PM Performed by: Rosaria Ferries, Lillias Difrancesco Pre-anesthesia Checklist: Patient identified, Emergency Drugs available, Suction available and Patient being monitored Patient Re-evaluated:Patient Re-evaluated prior to inductionOxygen Delivery Method: Circle system utilized Preoxygenation: Pre-oxygenation with 100% oxygen Intubation Type: IV induction Laryngoscope Size: Mac and 3 Grade View: Grade I Tube type: Oral Tube size: 7.0 mm Number of attempts: 1 Airway Equipment and Method: Stylet

## 2015-08-23 NOTE — Transfer of Care (Signed)
Immediate Anesthesia Transfer of Care Note  Patient: Deborah Marquez  Procedure(s) Performed: Procedure(s): LAPAROSCOPIC GASTRIC RESTRICTIVE DUODENAL PROCEDURE (DUODENAL SWITCH) (N/A) LAPAROSCOPIC HIATAL HERNIA REPAIR  Patient Location: PACU  Anesthesia Type:General  Level of Consciousness: awake, alert  and oriented  Airway & Oxygen Therapy: Patient Spontanous Breathing and Patient connected to face mask oxygen  Post-op Assessment: Report given to RN and Post -op Vital signs reviewed and stable  Post vital signs: Reviewed and stable  Last Vitals:  Filed Vitals:   08/23/15 1645  BP: 120/66  Pulse: 86  Temp: 37.3 C  Resp: 14    Complications: No apparent anesthesia complications

## 2015-08-23 NOTE — H&P (Signed)
  See faxed H and P

## 2015-08-24 ENCOUNTER — Encounter: Payer: Self-pay | Admitting: Specialist

## 2015-08-24 LAB — CBC WITH DIFFERENTIAL/PLATELET
Basophils Absolute: 0.1 10*3/uL (ref 0–0.1)
Basophils Relative: 1 %
EOS ABS: 0 10*3/uL (ref 0–0.7)
HCT: 38.1 % (ref 35.0–47.0)
Hemoglobin: 12.5 g/dL (ref 12.0–16.0)
LYMPHS ABS: 1.1 10*3/uL (ref 1.0–3.6)
Lymphocytes Relative: 10 %
MCH: 28.3 pg (ref 26.0–34.0)
MCHC: 32.8 g/dL (ref 32.0–36.0)
MCV: 86.1 fL (ref 80.0–100.0)
Monocytes Absolute: 0.5 10*3/uL (ref 0.2–0.9)
Neutro Abs: 9.1 10*3/uL — ABNORMAL HIGH (ref 1.4–6.5)
Neutrophils Relative %: 84 %
PLATELETS: 55 10*3/uL — AB (ref 150–440)
RBC: 4.42 MIL/uL (ref 3.80–5.20)
RDW: 15.1 % — ABNORMAL HIGH (ref 11.5–14.5)
WBC: 10.8 10*3/uL (ref 3.6–11.0)

## 2015-08-24 LAB — COMPREHENSIVE METABOLIC PANEL
ALT: 37 U/L (ref 14–54)
ANION GAP: 11 (ref 5–15)
AST: 38 U/L (ref 15–41)
Albumin: 3.9 g/dL (ref 3.5–5.0)
Alkaline Phosphatase: 62 U/L (ref 38–126)
BUN: 16 mg/dL (ref 6–20)
CHLORIDE: 104 mmol/L (ref 101–111)
CO2: 24 mmol/L (ref 22–32)
Calcium: 8.9 mg/dL (ref 8.9–10.3)
Creatinine, Ser: 0.64 mg/dL (ref 0.44–1.00)
GFR calc non Af Amer: >60 mL/min (ref >60)
Glucose, Bld: 121 mg/dL — ABNORMAL HIGH (ref 65–99)
POTASSIUM: 4.7 mmol/L (ref 3.5–5.1)
SODIUM: 139 mmol/L (ref 135–145)
Total Bilirubin: 0.4 mg/dL (ref 0.3–1.2)
Total Protein: 7.3 g/dL (ref 6.5–8.1)

## 2015-08-24 MED ORDER — ENOXAPARIN SODIUM 40 MG/0.4ML ~~LOC~~ SOLN: 40.0000 mg | SUBCUTANEOUS | Status: DC

## 2015-08-24 NOTE — Progress Notes (Signed)
Alert and oriented. VSS. O2 at 1lpm via [place] with sats in mid upper 90s.  Morphine given x1 with positive results. Tolerating bariatric clears.  Encouraged pt to ambulate but pt refused and stated she was tired and will try later. JP draining small amt of serosanguinous fluid. No n/v noted. Resting quietly at this time. Husband at bedside. Will cont to monitor.

## 2015-08-24 NOTE — Discharge Instructions (Signed)
Follow all MD discharge instructions. Take all medications as prescribed. Keep all follow up appointments. Contact your doctor if you have any questions.

## 2015-08-24 NOTE — Progress Notes (Signed)
INTERVENTION:  RD consulted for nutrition education regarding inpatient bariatric surgery.   RD provided "The Liquid Diet" handout from the Bariatric Surgery Guide from the [facility] . This handout previously provided to patient prior to surgery is a duplicate copy. Discussed what foods/liquids are consistent with a Clear Liquid Diet and reinforced Key Concepts such as no carbonation, no caffeine, or sugar containing beverages. Provided methods to prevent dehydration and promote protein intake, using clock and sample fluid schedule. RD encouraged follow-up with outpatient dietitian after discharge.  Teach back method used.  Expect good compliance.  NUTRITION DIAGNOSIS:  Food and nutrition knowledge related deficit related to recent bariatric surgery as evidenced by dietitian consult for nutrition education   GOAL:  Patient will be able to sip and tolerate CL within 24-48 hours  MONITOR:  Energy intake Digestive system  ASSESSMENT:  Pt s/p lap bilopancreatic diversion with duodunal switch   Body mass index is 56.62 kg/(m^2).   Current diet order is bariatric clear liquids with unjury supplement TID, patient is consuming approximately 4ml at this time.   Labs and medications reviewed.   LOW Care Level  Achille Xiang B. Zenia Resides, [place], [place] (pager)

## 2015-08-25 LAB — SURGICAL PATHOLOGY

## 2015-08-26 NOTE — Anesthesia Postprocedure Evaluation (Signed)
  Anesthesia Post-op Note  Patient: Deborah Marquez  Procedure(s) Performed: Procedure(s): LAPAROSCOPIC GASTRIC RESTRICTIVE DUODENAL PROCEDURE (DUODENAL SWITCH) (N/A) LAPAROSCOPIC HIATAL HERNIA REPAIR  Anesthesia type:General ETT  Patient location: PACU  Post pain: Pain level controlled  Post assessment: Post-op Vital signs reviewed, Patient's Cardiovascular Status Stable, Respiratory Function Stable, Patent Airway and No signs of Nausea or vomiting  Post vital signs: Reviewed and stable  Last Vitals:  Filed Vitals:   08/24/15 0739  BP: 129/66  Pulse: 89  Temp: 36.9 C  Resp: 18    Level of consciousness: awake, alert  and patient cooperative  Complications: No apparent anesthesia complications

## 2015-09-06 NOTE — Discharge Summary (Signed)
Physician Discharge Summary  Patient ID: Deborah Marquez MRN: [id number] DOB/AGE: 01/17/1961 54 y.o.  Admit date: 08/23/2015 Discharge date: 09/06/2015  Admission Diagnoses:Morbid Obesity  Discharge Diagnoses:  Active Problems:   Morbid obesity   Discharged Condition: good  Hospital Course: uneventful s/p laparoscopic duodenal switch dual anastamosis  Consults: None  Significant Diagnostic Studies: labs: per chart  Treatments: IV hydration and surgery: laparoscopic duodenal switch - dual anastamosis  Discharge Exam: Blood pressure 129/66, pulse 89, temperature 98.4 F (36.9 C), temperature source Oral, resp. rate 18, height 5\' 4"  (1.626 m), weight 149.687 kg (330 lb), SpO2 97 %. seen by another provider  Disposition: 01-Home or Self Care  Discharge Instructions    Call MD for:  difficulty breathing, headache or visual disturbances    Complete by:  As directed      Call MD for:  extreme fatigue    Complete by:  As directed      Call MD for:  hives    Complete by:  As directed      Call MD for:  persistant dizziness or light-headedness    Complete by:  As directed      Call MD for:  persistant nausea and vomiting    Complete by:  As directed      Call MD for:  redness, tenderness, or signs of infection (pain, swelling, redness, odor or green/yellow discharge around incision site)    Complete by:  As directed      Call MD for:  severe uncontrolled pain    Complete by:  As directed      Call MD for:  temperature >100.4    Complete by:  As directed      Discharge instructions    Complete by:  As directed   During your recent anesthetic, you were given the medication sugammadex (Bridion). This medication interacts with hormonal forms of birth control (oral contraceptives and injected or implanted birth control) and may make them ineffective. IFYOU USE ANY HORMONAL FORM OF BIRTH CONTROL, YOU MUST USE AN ADDITIONAL BARRIER BIRTH CONTROL FOR METHOD FOR SEVEN DAYS after receiving  sugammadex (Bridion) or there is a chance you could become pregnant.     Discharge instructions    Complete by:  As directed   Remember to start your chewable or liquid B complex once you arrive home and take this daily for 30 days. You may continue to take this beyond 30 days if you choose. Stick with a Clear liquid diet for 2 days post operatively then you may advance to a Full Liquid diet for 12 days, which means you will be on a strict liquid diet for 14 days. Your daily goal is going to be to get in 60-80g of protein and 64oz of fluid.     Driving Restrictions    Complete by:  As directed   You may not drive until 24 hours past your last dose of pain medications.     Increase activity slowly    Complete by:  As directed      Lifting restrictions    Complete by:  As directed   Do not lift more than 10-15 pounds for 4-6 weeks post operatively     No dressing needed    Complete by:  As directed      No wound care    Complete by:  As directed      Other Restrictions    Complete by:  As directed   Avoid using your  core muscles for 30 days after surgery - motions like pushing, pulling, climbing etc. Make sure to get up and walk frequently every day to help avoid developing blood clots.            Medication List    STOP taking these medications        ibuprofen 800 MG tablet  Commonly known as:  ADVIL,MOTRIN     tiZANidine 2 MG tablet  Commonly known as:  ZANAFLEX      TAKE these medications        DULoxetine 30 MG capsule  Commonly known as:  CYMBALTA  Take 30 mg by mouth 2 (two) times daily.     enoxaparin 40 MG/0.4ML injection  Commonly known as:  LOVENOX  Inject 0.4 mLs (40 mg total) into the skin daily.     fexofenadine-pseudoephedrine 60-120 MG per tablet  Commonly known as:  ALLEGRA-D  Take 1 tablet by mouth at bedtime as needed.     HYDROcodone-acetaminophen 7.5-325 mg/15 ml solution  Commonly known as:  HYCET  Take 15 mLs by mouth 4 (four) times daily as  needed for moderate pain.     omeprazole 20 MG capsule  Commonly known as:  PRILOSEC  Take 20 mg by mouth daily.     ondansetron 4 MG tablet  Commonly known as:  ZOFRAN  Take 1 tablet (4 mg total) by mouth 4 (four) times daily as needed for nausea or vomiting.     pregabalin 75 MG capsule  Commonly known as:  LYRICA  Take 75 mg by mouth 2 (two) times daily.           Follow-up Information    Follow up with Carmelina Noun, MD.   Specialty:  Specialist   Why:  August 30, 2015   Contact information:   [address] [place] [address] [phone]       Signed: Mardelle Matte 09/06/2015, 4:58 PM

## 2016-06-08 DIAGNOSIS — K219 Gastro-esophageal reflux disease without esophagitis: Secondary | ICD-10-CM | POA: Insufficient documentation

## 2017-11-20 DIAGNOSIS — Z9884 Bariatric surgery status: Secondary | ICD-10-CM | POA: Insufficient documentation

## 2017-12-05 DIAGNOSIS — K429 Umbilical hernia without obstruction or gangrene: Secondary | ICD-10-CM | POA: Insufficient documentation

## 2018-12-16 DIAGNOSIS — Z6824 Body mass index (BMI) 24.0-24.9, adult: Secondary | ICD-10-CM | POA: Insufficient documentation

## 2018-12-16 DIAGNOSIS — K8020 Calculus of gallbladder without cholecystitis without obstruction: Secondary | ICD-10-CM | POA: Insufficient documentation

## 2020-01-19 DIAGNOSIS — Z20822 Contact with and (suspected) exposure to covid-19: Secondary | ICD-10-CM | POA: Insufficient documentation

## 2020-05-14 DIAGNOSIS — S066X0A Traumatic subarachnoid hemorrhage without loss of consciousness, initial encounter: Secondary | ICD-10-CM | POA: Insufficient documentation

## 2020-05-15 DIAGNOSIS — I609 Nontraumatic subarachnoid hemorrhage, unspecified: Secondary | ICD-10-CM | POA: Insufficient documentation

## 2020-09-02 ENCOUNTER — Ambulatory Visit (INDEPENDENT_AMBULATORY_CARE_PROVIDER_SITE_OTHER): Payer: BC Managed Care – PPO

## 2020-09-02 ENCOUNTER — Ambulatory Visit
Admission: EM | Admit: 2020-09-02 | Discharge: 2020-09-02 | Disposition: A | Discharge status: Home / Self Care | Payer: BC Managed Care – PPO

## 2020-09-02 ENCOUNTER — Other Ambulatory Visit: Payer: Self-pay

## 2020-09-02 ENCOUNTER — Encounter: Payer: Self-pay | Admitting: Emergency Medicine

## 2020-09-02 DIAGNOSIS — M79671 Pain in right foot: Secondary | ICD-10-CM | POA: Diagnosis not present

## 2020-09-02 DIAGNOSIS — M7989 Other specified soft tissue disorders: Secondary | ICD-10-CM

## 2020-09-02 DIAGNOSIS — S9031XA Contusion of right foot, initial encounter: Secondary | ICD-10-CM

## 2020-09-02 HISTORY — DX: Restless legs syndrome: G25.81

## 2020-09-02 NOTE — ED Provider Notes (Signed)
[facility]    CSN: [id number] Arrival date & time: 09/02/20  1749      History   Chief Complaint Chief Complaint  Patient presents with  . Foot Injury    DOI 09/02/20    HPI Deborah Marquez is a 59 y.o. female.   Patient presents for right foot pain.  She states that she dropped a bag of groceries on her foot about 3 to 4 hours ago.  She says the foot is swollen.  She has been icing the foot and taking ibuprofen without much relief.  She says she has a very large bruise and swelling of the foot which has only seemed to be getting worse.  Patient admits to previous surgery on the right foot.  She denies any numbness, tingling, weakness.  She says she can walk on the foot but it is painful.  No other injuries or concerns today.  HPI  Past Medical History:  Diagnosis Date  . Fibromyalgia   . GERD (gastroesophageal reflux disease)   . Restless leg syndrome     Patient Active Problem List   Diagnosis Date Noted  . Morbid obesity ([place]) 08/23/2015    Past Surgical History:  Procedure Laterality Date  . FOOT NEUROMA SURGERY Bilateral   . HAMMER TOE SURGERY Right   . INTRAUTERINE DEVICE INSERTION    . KNEE ARTHROSCOPY Right   . LAPAROSCOPIC GASTRIC RESTRICTIVE DUODENAL PROCEDURE (DUODENAL SWITCH) N/A 08/23/2015   Procedure: LAPAROSCOPIC GASTRIC RESTRICTIVE DUODENAL PROCEDURE (DUODENAL SWITCH);  Surgeon: Bonner Puna, MD;  Location: [facility] ORS;  Service: General;  Laterality: N/A;  . TONSILLECTOMY      OB History   No obstetric history on file.      Home Medications    Prior to Admission medications   Medication Sig Start Date End Date Taking? Authorizing Provider  butalbital-acetaminophen-caffeine (FIORICET) 50-325-40 MG tablet TAKE 1 TABLET BY MOUTH EVERY 4 HOURS AS NEEDED FOR HEADACHE 05/17/20  Yes [provider]  DULoxetine (CYMBALTA) 30 MG capsule Take 30 mg by mouth 2 (two) times daily.   Yes [provider]  fexofenadine-pseudoephedrine  (ALLEGRA-D) 60-120 MG per tablet Take 1 tablet by mouth at bedtime as needed.   Yes [provider]  gabapentin (NEURONTIN) 300 MG capsule Take 1 capsule by mouth at bedtime. 08/11/20  Yes [provider]  Melatonin 10 MG TABS Take 1 tablet by mouth at bedtime.   Yes [provider]  Multiple Vitamin (MULTI-VITAMINS) TABS Take 1 tablet by mouth daily.   Yes [provider]  omeprazole (PRILOSEC) 20 MG capsule Take 20 mg by mouth daily.   Yes [provider]  ondansetron (ZOFRAN) 4 MG tablet Take 1 tablet (4 mg total) by mouth 4 (four) times daily as needed for nausea or vomiting. 08/23/15  Yes Mardelle Matte, PA-C  enoxaparin (LOVENOX) 40 MG/0.4ML injection Inject 0.4 mLs (40 mg total) into the skin daily. 08/24/15   Mardelle Matte, PA-C  pregabalin (LYRICA) 75 MG capsule Take 75 mg by mouth 2 (two) times daily.    [provider]    Family History Family History  Problem Relation Age of Onset  . Diverticulosis Mother   . Hypertension Father   . Diabetes Father     Social History Social History   Tobacco Use  . Smoking status: Never Smoker  . Smokeless tobacco: Never Used  Vaping Use  . Vaping Use: Never used  Substance Use Topics  . Alcohol use:  Yes    Comment: rare  . Drug use: No     Allergies   Penicillins, Sulfa antibiotics, Sulfasalazine, Cephalosporins, Corn oil, Sulfanilamide, and Latex   Review of Systems Review of Systems  Constitutional: Negative for fatigue and fever.  Musculoskeletal: Positive for arthralgias, gait problem and joint swelling.  Skin: Positive for color change. Negative for rash and wound.  Neurological: Negative for weakness and numbness.     Physical Exam Triage Vital Signs ED Triage Vitals  Enc Vitals Group     BP 09/02/20 1815 129/81     Pulse Rate 09/02/20 1815 99     Resp 09/02/20 1815 18     Temp 09/02/20 1815 98.8 F (37.1 C)     Temp Source 09/02/20 1815 Oral     SpO2 09/02/20  1815 97 %     Weight 09/02/20 1815 155 lb (70.3 kg)     Height 09/02/20 1815 5\' 3"  (1.6 m)     Head Circumference --      Peak Flow --      Pain Score 09/02/20 1814 7     Pain Loc --      Pain Edu? --      Excl. in [place]? --    No data found.  Updated Vital Signs BP 129/81 (BP Location: Left Arm)   Pulse 99   Temp 98.8 F (37.1 C) (Oral)   Resp 18   Ht 5\' 3"  (1.6 m)   Wt 155 lb (70.3 kg)   SpO2 97%   BMI 27.46 kg/m        Physical Exam Vitals and nursing note reviewed.  Constitutional:      General: She is not in acute distress.    Appearance: Normal appearance. She is not ill-appearing or toxic-appearing.  HENT:     Head: Normocephalic and atraumatic.  Eyes:     General: No scleral icterus.       Right eye: No discharge.        Left eye: No discharge.     Conjunctiva/sclera: Conjunctivae normal.  Cardiovascular:     Rate and Rhythm: Normal rate.     Pulses: Normal pulses.  Pulmonary:     Effort: Pulmonary effort is normal. No respiratory distress.  Musculoskeletal:     Cervical back: Neck supple.     Right foot: Decreased range of motion. Swelling (moderate swelling and bruising of entire dorsal foot) and tenderness (diffuse TTP entire dorsal foot where hematoma is present) present. Normal pulse.     Left foot: Normal.       Legs:  Skin:    General: Skin is dry.  Neurological:     General: No focal deficit present.     Mental Status: She is alert. Mental status is at baseline.     Motor: No weakness.     Gait: Gait normal.  Psychiatric:        Mood and Affect: Mood normal.        Behavior: Behavior normal.        Thought Content: Thought content normal.      UC Treatments / Results  Labs (all labs ordered are listed, but only abnormal results are displayed) Labs Reviewed - No data to display  EKG   Radiology DG Foot Complete Right  Result Date: 09/02/2020 CLINICAL DATA:  Pain and swelling after dropping back of groceries on foot today EXAM:  RIGHT FOOT COMPLETE - 3+ VIEW COMPARISON:  None. FINDINGS: Prominent dorsal right  mid foot soft tissue swelling. No fracture or dislocation. Intact screw in the distal right second metatarsal. Mild first MTP joint osteoarthritis with overlying medial distal right foot soft tissue swelling. No suspicious focal osseous lesions. IMPRESSION: 1. Prominent dorsal right mid foot soft tissue swelling, with no fracture or dislocation. 2. Mild first MTP joint osteoarthritis with overlying medial distal right foot soft tissue swelling. Electronically Signed   By: Ilona Sorrel M.D.   On: 09/02/2020 18:49    Procedures Procedures (including critical care time)  Medications Ordered in UC Medications - No data to display  Initial Impression / Assessment and Plan / UC Course  I have reviewed the triage vital signs and the nursing notes.  Pertinent labs & imaging results that were available during my care of the patient were reviewed by me and considered in my medical decision making (see chart for details).   Imaging of right foot obtained today.  Images are negative for fracture.  Advised patient that she has a large hematoma or contusion and the most important thing is to make sure she is icing the foot every couple of hours.  Advised take Tylenol Motrin for pain.  If not feeling better in a week she should have repeat x-rays.  She is to be seen sooner if anything worsens. Patient agreeable.   Final Clinical Impressions(s) / UC Diagnoses   Final diagnoses:  Contusion of right foot, initial encounter  Right foot pain     Discharge Instructions     X-rays are negative for fractures today.  You do have a very large hematoma/contusion of the foot which I'm sure is very painful.  Make sure you are icing the foot every 2 hours for at least 10 minutes at a time.  Try to keep it elevated.  You can take Tylenol and Motrin for pain relief.  You are still not feeling better in a week you should be seen again and  have repeat x-rays.  Seek reexamination sooner if any symptoms worsen.    ED Prescriptions    None     PDMP not reviewed this encounter.   Danton Clap, PA-C 09/02/20 1919

## 2020-09-02 NOTE — Discharge Instructions (Addendum)
X-rays are negative for fractures today.  You do have a very large hematoma/contusion of the foot which I'm sure is very painful.  Make sure you are icing the foot every 2 hours for at least 10 minutes at a time.  Try to keep it elevated.  You can take Tylenol and Motrin for pain relief.  You are still not feeling better in a week you should be seen again and have repeat x-rays.  Seek reexamination sooner if any symptoms worsen.

## 2020-09-02 NOTE — ED Triage Notes (Signed)
Patient in today c/o right foot pain after dropping a bag of groceries on her foot at ~3pm today. Patient has iced her foot and has taken Ibuprofen without relief.

## 2020-11-23 ENCOUNTER — Ambulatory Visit (INDEPENDENT_AMBULATORY_CARE_PROVIDER_SITE_OTHER): Payer: BC Managed Care – PPO | Admitting: Podiatry

## 2020-11-23 ENCOUNTER — Other Ambulatory Visit: Payer: Self-pay

## 2020-11-23 ENCOUNTER — Ambulatory Visit (INDEPENDENT_AMBULATORY_CARE_PROVIDER_SITE_OTHER): Payer: BC Managed Care – PPO

## 2020-11-23 ENCOUNTER — Encounter: Payer: Self-pay | Admitting: Podiatry

## 2020-11-23 DIAGNOSIS — G5762 Lesion of plantar nerve, left lower limb: Secondary | ICD-10-CM | POA: Diagnosis not present

## 2020-11-23 DIAGNOSIS — D696 Thrombocytopenia, unspecified: Secondary | ICD-10-CM | POA: Insufficient documentation

## 2020-11-23 DIAGNOSIS — M2011 Hallux valgus (acquired), right foot: Secondary | ICD-10-CM | POA: Diagnosis not present

## 2020-11-23 DIAGNOSIS — I7300 Raynaud's syndrome without gangrene: Secondary | ICD-10-CM | POA: Insufficient documentation

## 2020-11-23 DIAGNOSIS — G5782 Other specified mononeuropathies of left lower limb: Secondary | ICD-10-CM

## 2020-11-23 DIAGNOSIS — D3613 Benign neoplasm of peripheral nerves and autonomic nervous system of lower limb, including hip: Secondary | ICD-10-CM

## 2020-11-23 DIAGNOSIS — IMO0001 Reserved for inherently not codable concepts without codable children: Secondary | ICD-10-CM | POA: Insufficient documentation

## 2020-11-23 DIAGNOSIS — G4733 Obstructive sleep apnea (adult) (pediatric): Secondary | ICD-10-CM | POA: Insufficient documentation

## 2020-11-23 DIAGNOSIS — F32A Depression, unspecified: Secondary | ICD-10-CM | POA: Insufficient documentation

## 2020-11-23 MED ORDER — TRIAMCINOLONE ACETONIDE 40 MG/ML IJ SUSP
20.0000 mg | Freq: Once | INTRAMUSCULAR | Status: AC
  Administered 2020-11-23: 20 mg

## 2020-11-23 NOTE — Progress Notes (Signed)
Subjective:  Patient ID: Deborah Marquez, female    DOB: 1961/07/01,  MRN: [id number] HPI Chief Complaint  Patient presents with  . Foot Pain    Patient presents today for bilat foot pain.  She says she has been treated for neuroma left foot under toes 2,3 in the past and it flared back up about 2-3 months ago.  Now she has numbness, and prickly pains mostly when walking.  She also c/o pain right hallux bunion which has started aching more in the past several months and hurts more in the evenings    59 y.o. female presents with the above complaint.   ROS: Denies fever chills nausea vomiting muscle aches pains calf pain back pain chest pain shortness of breath.  Past Medical History:  Diagnosis Date  . Fibromyalgia   . GERD (gastroesophageal reflux disease)   . Restless leg syndrome    Past Surgical History:  Procedure Laterality Date  . FOOT NEUROMA SURGERY Bilateral   . HAMMER TOE SURGERY Right   . INTRAUTERINE DEVICE INSERTION    . KNEE ARTHROSCOPY Right   . LAPAROSCOPIC GASTRIC RESTRICTIVE DUODENAL PROCEDURE (DUODENAL SWITCH) N/A 08/23/2015   Procedure: LAPAROSCOPIC GASTRIC RESTRICTIVE DUODENAL PROCEDURE (DUODENAL SWITCH);  Surgeon: Bonner Puna, MD;  Location: [facility] ORS;  Service: General;  Laterality: N/A;  . TONSILLECTOMY      Current Outpatient Medications:  .  Acetaminophen-Codeine 300-30 MG tablet, Take 1 tablet by mouth 2 (two) times daily., Disp: , Rfl:  .  Cetirizine HCl 10 MG CAPS, Take by mouth., Disp: , Rfl:  .  sertraline (ZOLOFT) 50 MG tablet, sertraline 50 mg tablet, Disp: , Rfl:  .  amLODipine (NORVASC) 2.5 MG tablet, amlodipine 2.5 mg tablet, Disp: , Rfl:  .  ascorbic acid (VITAMIN C) 1000 MG tablet, Take by mouth., Disp: , Rfl:  .  aspirin-acetaminophen-caffeine (EXCEDRIN MIGRAINE) 250-250-65 MG tablet, Take by mouth., Disp: , Rfl:  .  beta carotene 25000 UNIT capsule, Take by mouth., Disp: , Rfl:  .  Cholecalciferol 125 MCG (5000 UT) TABS, Take by mouth., Disp:  , Rfl:  .  DULoxetine (CYMBALTA) 30 MG capsule, Take 30 mg by mouth 2 (two) times daily., Disp: , Rfl:  .  fexofenadine-pseudoephedrine (ALLEGRA-D) 60-120 MG per tablet, Take 1 tablet by mouth at bedtime as needed., Disp: , Rfl:  .  gabapentin (NEURONTIN) 300 MG capsule, Take 1 capsule by mouth at bedtime., Disp: , Rfl:  .  ibuprofen (ADVIL) 800 MG tablet, Take by mouth., Disp: , Rfl:  .  Melatonin 10 MG TABS, Take 1 tablet by mouth at bedtime., Disp: , Rfl:  .  metoprolol tartrate (LOPRESSOR) 25 MG tablet, metoprolol tartrate 25 mg tablet, Disp: , Rfl:  .  Multiple Vitamin (MULTI-VITAMINS) TABS, Take 1 tablet by mouth daily., Disp: , Rfl:  .  omeprazole (PRILOSEC) 20 MG capsule, Take 20 mg by mouth daily., Disp: , Rfl:  .  ondansetron (ZOFRAN) 4 MG tablet, Take 1 tablet (4 mg total) by mouth 4 (four) times daily as needed for nausea or vomiting., Disp: 20 tablet, Rfl: 0 .  zinc gluconate 50 MG tablet, Take by mouth., Disp: , Rfl:   Allergies  Allergen Reactions  . Penicillins Anaphylaxis and Rash  . Sulfa Antibiotics Anaphylaxis  . Sulfasalazine Anaphylaxis  . Cephalosporins Rash  . Corn Oil     Other reaction(s): Vomiting  . Sulfanilamide   . Latex Rash and Hives   Review of Systems Objective:  There were no  vitals filed for this visit.  General: Well developed, nourished, in no acute distress, alert and oriented x3   Dermatological: Skin is warm, dry and supple bilateral. Nails x 10 are well maintained; remaining integument appears unremarkable at this time. There are no open sores, no preulcerative lesions, no rash or signs of infection present.  Vascular: Dorsalis Pedis artery and Posterior Tibial artery pedal pulses are 2/4 bilateral with immedate capillary fill time. Pedal hair growth present. No varicosities and no lower extremity edema present bilateral.   Neruologic: Grossly intact via light touch bilateral. Vibratory intact via tuning fork bilateral. Protective threshold  with Semmes Wienstein monofilament intact to all pedal sites bilateral. Patellar and Achilles deep tendon reflexes 2+ bilateral. No Babinski or clonus noted bilateral.  Palpable neuroma third interdigital space left foot.  Musculoskeletal: No gross boney pedal deformities bilateral. No pain, crepitus, or limitation noted with foot and ankle range of motion bilateral. Muscular strength 5/5 in all groups tested bilateral.  She has a mild hallux valgus deformity right foot with mild tenderness on palpation dorsal lateral aspect of the joint.  Mild tenderness on end range of motion.  Mild tenderness on palpation of the medial condyle.  No erythema cellulitis drainage or odor.  Gait: Unassisted, Nonantalgic.    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  Joint space narrowing lateral first metatarsophalangeal joint.  Retention of screw second metatarsal.  Left foot demonstrates osseously mature individual mild osteopenia.  No acute findings.  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  Plan: Discussed etiology pathology conservative surgical therapies recommended highly that she start to use Voltaren gel for the capsulitis of the first metatarsophalangeal joint right foot.  Offered her an injection but she declined.  Injected 10 mg of Kenalog to the third interdigital space of the left foot for the neuroma.  We will follow-up with her in 1 month to 6 weeks.     Dezi Schaner T. [place], [place]

## 2021-01-12 ENCOUNTER — Encounter: Payer: Self-pay | Admitting: Podiatry

## 2021-01-12 ENCOUNTER — Ambulatory Visit (INDEPENDENT_AMBULATORY_CARE_PROVIDER_SITE_OTHER): Payer: BC Managed Care – PPO | Admitting: Podiatry

## 2021-01-12 ENCOUNTER — Other Ambulatory Visit: Payer: Self-pay

## 2021-01-12 DIAGNOSIS — G5762 Lesion of plantar nerve, left lower limb: Secondary | ICD-10-CM

## 2021-01-12 MED ORDER — TRIAMCINOLONE ACETONIDE 40 MG/ML IJ SUSP
20.0000 mg | Freq: Once | INTRAMUSCULAR | Status: AC
  Administered 2021-01-12: 20 mg

## 2021-01-13 NOTE — Patient Instructions (Signed)
Skis me

## 2021-01-13 NOTE — Progress Notes (Signed)
Presents today for follow-up of her neuroma.  States that it has moved from the third interdigital space of the left foot to the second interspace of the left foot as she points to this 5 1 interspace over.  Objective: Vital signs are stable she alert oriented x3 still has some tenderness on palpation to the third interdigital space with majority of her tenderness now located in the second interdigital space completely between the second and third metatarsals.  Assessment: Neuroma is third interdigital space and now second interdigital space.  Plan: Injected the second interspace today with 10 mg Kenalog 5 mg Marcaine point maximal tenderness.  I like to follow-up with her in about 3 to 4 weeks.

## 2021-08-31 DIAGNOSIS — D509 Iron deficiency anemia, unspecified: Secondary | ICD-10-CM | POA: Insufficient documentation

## 2021-08-31 DIAGNOSIS — M159 Polyosteoarthritis, unspecified: Secondary | ICD-10-CM | POA: Insufficient documentation

## 2021-11-25 ENCOUNTER — Ambulatory Visit
Admission: EM | Admit: 2021-11-25 | Discharge: 2021-11-25 | Disposition: A | Discharge status: Home / Self Care | Payer: BC Managed Care – PPO | Attending: Family Medicine | Admitting: Family Medicine

## 2021-11-25 ENCOUNTER — Encounter: Payer: Self-pay | Admitting: Emergency Medicine

## 2021-11-25 ENCOUNTER — Other Ambulatory Visit: Payer: Self-pay

## 2021-11-25 DIAGNOSIS — M545 Low back pain, unspecified: Secondary | ICD-10-CM | POA: Diagnosis not present

## 2021-11-25 DIAGNOSIS — R059 Cough, unspecified: Secondary | ICD-10-CM | POA: Diagnosis present

## 2021-11-25 DIAGNOSIS — J988 Other specified respiratory disorders: Secondary | ICD-10-CM | POA: Insufficient documentation

## 2021-11-25 DIAGNOSIS — Y92410 Unspecified street and highway as the place of occurrence of the external cause: Secondary | ICD-10-CM | POA: Insufficient documentation

## 2021-11-25 DIAGNOSIS — Z20822 Contact with and (suspected) exposure to covid-19: Secondary | ICD-10-CM | POA: Diagnosis not present

## 2021-11-25 LAB — RESP PANEL BY RT-PCR (FLU A&B, COVID) ARPGX2
Influenza A by PCR: NEGATIVE
Influenza B by PCR: NEGATIVE
SARS Coronavirus 2 by RT PCR: NEGATIVE

## 2021-11-25 MED ORDER — TRAMADOL HCL 50 MG PO TABS: 50.0000 mg | ORAL_TABLET | Freq: Three times a day (TID) | ORAL | 0 refills | Status: DC | PRN

## 2021-11-25 MED ORDER — PROMETHAZINE-DM 6.25-15 MG/5ML PO SYRP: 5.0000 mL | ORAL_SOLUTION | Freq: Four times a day (QID) | ORAL | 0 refills | Status: DC | PRN
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
## 2021-11-25 NOTE — Discharge Instructions (Signed)
Medication as prescribed.  We will call with the results of your test.  Take care  Dr. Lacinda Axon

## 2022-04-08 ENCOUNTER — Ambulatory Visit (INDEPENDENT_AMBULATORY_CARE_PROVIDER_SITE_OTHER): Payer: BC Managed Care – PPO

## 2022-04-08 ENCOUNTER — Other Ambulatory Visit: Payer: Self-pay

## 2022-04-08 ENCOUNTER — Ambulatory Visit
Admission: EM | Admit: 2022-04-08 | Discharge: 2022-04-08 | Disposition: A | Discharge status: Home / Self Care | Payer: BC Managed Care – PPO | Attending: Internal Medicine | Admitting: Internal Medicine

## 2022-04-08 DIAGNOSIS — M79662 Pain in left lower leg: Secondary | ICD-10-CM

## 2022-04-08 DIAGNOSIS — S8992XA Unspecified injury of left lower leg, initial encounter: Secondary | ICD-10-CM | POA: Diagnosis not present

## 2022-04-08 MED ORDER — HYDROCODONE-ACETAMINOPHEN 7.5-325 MG PO TABS: 1.0000 | ORAL_TABLET | Freq: Four times a day (QID) | ORAL | 0 refills | Status: AC | PRN

## 2022-04-08 NOTE — Discharge Instructions (Signed)
Follow up with ortho tomorrow ?
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
## 2022-04-08 NOTE — ED Triage Notes (Signed)
Patient c.o falling yesterday, Patient is having left leg pain that is radiating down her leg towards her foot. Patient did take over the counter pain relief but did not help. ?

## 2022-04-12 DIAGNOSIS — F1011 Alcohol abuse, in remission: Secondary | ICD-10-CM | POA: Insufficient documentation

## 2022-04-12 DIAGNOSIS — E559 Vitamin D deficiency, unspecified: Secondary | ICD-10-CM | POA: Insufficient documentation

## 2022-04-25 DIAGNOSIS — S8012XA Contusion of left lower leg, initial encounter: Secondary | ICD-10-CM | POA: Insufficient documentation

## 2022-04-25 DIAGNOSIS — M79606 Pain in leg, unspecified: Secondary | ICD-10-CM | POA: Insufficient documentation

## 2022-08-15 IMAGING — CR DG TIBIA/FIBULA 2V*L*
2 series · 2 of 2 positions shown · non-contrast
Comparison: Left knee radiographs 01/11/2007

CLINICAL DATA: Fell on knees.  Severe shin pain.  Pain.

EXAM:
LEFT TIBIA AND FIBULA - 2 VIEW

[tibia ap]
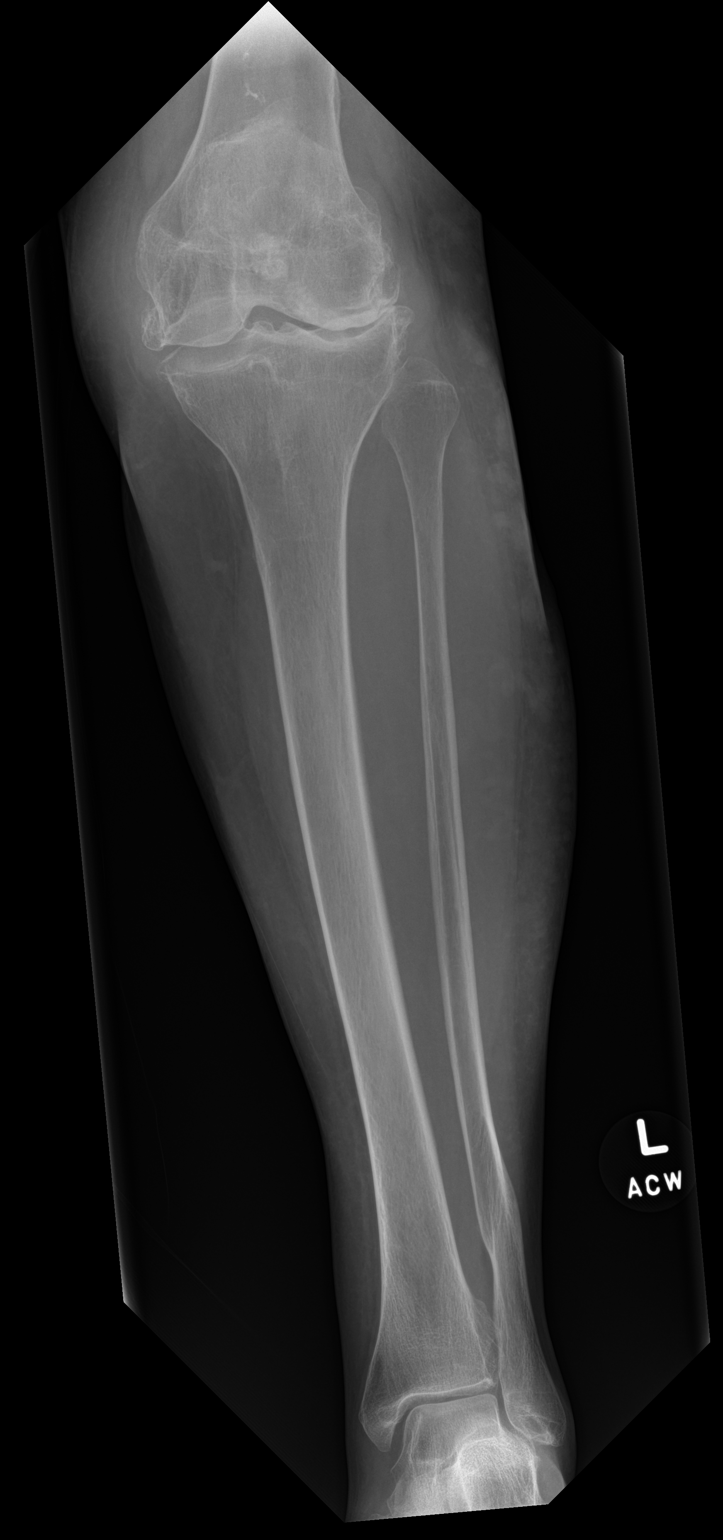

[tibia lat]
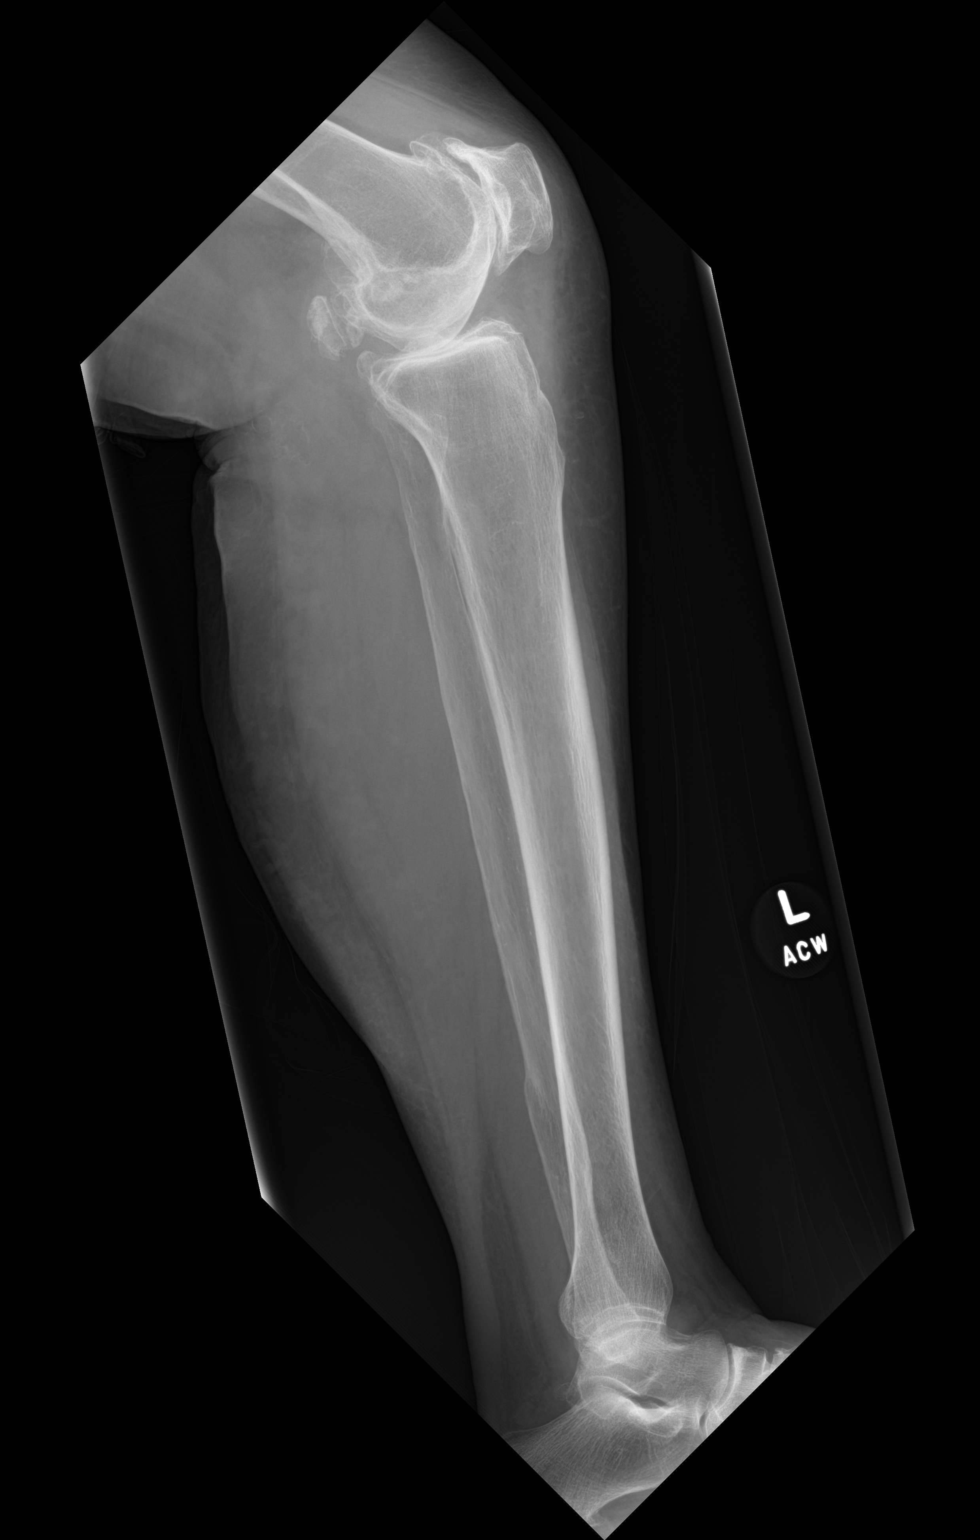

[2 of 2 positions shown; findings below may reference images not displayed]

FINDINGS: Severe lateral compartment joint space narrowing with bone-on-bone
contact and peripheral osteophytosis. Mild chronic scalloping of the
lateral tibial plateau articular surface. Moderate medial
compartment joint space narrowing with large peripheral degenerative
osteophytes. Severe patellofemoral joint space narrowing and
peripheral osteophytosis. No knee joint effusion is seen.

There is an old healed, minimally displaced distal fibular
diaphyseal fracture.

No acute fracture or dislocation. The ankle mortise is symmetric and
intact.
IMPRESSION: :
IMPRESSION: 1. Severe lateral and patellofemoral greater than medial compartment
of the knee osteoarthritis.
2. Old healed distal fibular diaphyseal fracture.
3. No acute fracture.

## 2023-02-26 DIAGNOSIS — M797 Fibromyalgia: Secondary | ICD-10-CM | POA: Insufficient documentation

## 2023-03-06 DIAGNOSIS — Z8262 Family history of osteoporosis: Secondary | ICD-10-CM | POA: Insufficient documentation

## 2023-09-12 DIAGNOSIS — E618 Deficiency of other specified nutrient elements: Secondary | ICD-10-CM | POA: Insufficient documentation

## 2023-09-12 DIAGNOSIS — F1090 Alcohol use, unspecified, uncomplicated: Secondary | ICD-10-CM | POA: Insufficient documentation

## 2023-09-12 DIAGNOSIS — E213 Hyperparathyroidism, unspecified: Secondary | ICD-10-CM | POA: Insufficient documentation

## 2024-10-01 ENCOUNTER — Encounter: Payer: Self-pay | Admitting: Emergency Medicine

## 2024-10-01 ENCOUNTER — Ambulatory Visit: Admission: EM | Admit: 2024-10-01 | Discharge: 2024-10-01 | Disposition: A | Discharge status: Home / Self Care

## 2024-10-01 DIAGNOSIS — H9201 Otalgia, right ear: Secondary | ICD-10-CM | POA: Diagnosis not present

## 2024-10-01 DIAGNOSIS — H6991 Unspecified Eustachian tube disorder, right ear: Secondary | ICD-10-CM | POA: Diagnosis not present

## 2024-10-01 MED ORDER — PREDNISONE 10 MG PO TABS: ORAL_TABLET | ORAL | 0 refills | Status: AC

## 2024-10-01 NOTE — ED Provider Notes (Signed)
 [facility]    CSN: [id number] Arrival date & time: 10/01/24  1333      History   Chief Complaint Chief Complaint  Patient presents with   Otalgia    HPI Deborah Marquez is a 63 y.o. female presenting for right ear fullness, pain, tinnitus, and congestion x 4 days.  Denies fever, runny nose, cough, sore throat or painful swallowing.  Slightly reduced hearing on the right side compared to left.  No drainage from ear.  Has tried decongestants which initially seemed to help but not today.  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   Contusion of left lower leg 04/25/2022   Pain in lower limb 04/25/2022   Alcohol use disorder, mild, in early remission 04/12/2022   Vitamin D deficiency 04/12/2022   Generalized osteoarthritis 08/31/2021   Iron deficiency anemia 08/31/2021   Depression, unspecified 11/23/2020   Myalgia and myositis 11/23/2020   Obstructive sleep apnea of adult 11/23/2020   Raynaud's phenomenon 11/23/2020   Thrombocytopenia 11/23/2020   SAH (subarachnoid hemorrhage) (HCC) 05/15/2020   Subarachnoid hemorrhage following injury, no loss of consciousness (HCC) 05/14/2020   Exposure to severe acute respiratory syndrome coronavirus 2 (SARS-CoV-2) 01/19/2020   BMI 24.0-24.9, adult 12/16/2018   Calculus of gallbladder without cholecystitis without obstruction 12/16/2018   Umbilical hernia 12/05/2017   Status post bariatric surgery 11/20/2017   Gastroesophageal reflux disease 06/08/2016   Morbid obesity (HCC) 08/23/2015   Abnormal uterine bleeding 05/04/2013   Fibromyalgia 05/04/2013    Past Surgical History:  Procedure Laterality Date    FOOT NEUROMA SURGERY Bilateral    HAMMER TOE SURGERY Right    INTRAUTERINE DEVICE INSERTION     KNEE ARTHROSCOPY Right    LAPAROSCOPIC GASTRIC RESTRICTIVE DUODENAL PROCEDURE (DUODENAL SWITCH) N/A 08/23/2015   Procedure: LAPAROSCOPIC GASTRIC RESTRICTIVE DUODENAL PROCEDURE (DUODENAL SWITCH);  Surgeon: Thom CHRISTELLA Pin, MD;  Location: [facility];  Service: General;  Laterality: N/A;   TONSILLECTOMY      OB History   No obstetric history on file.      Home Medications    Prior to Admission medications   Medication Sig Start Date End Date Taking? Authorizing Provider  alendronate (FOSAMAX) 70 MG tablet Take 70 mg by mouth. 08/06/24 08/06/25 Yes [provider]  butalbital-acetaminophen -caffeine (FIORICET) 50-325-40 MG tablet  05/17/20  Yes [provider]  Calcium Citrate-Vitamin D (CAL-CITRATE PLUS VITAMIN D) 250-2.5 MG-MCG TABS  08/18/23  Yes [provider]  diazepam (VALIUM) 5 MG tablet Take 5 mg by mouth 2 (two) times daily as needed. 05/06/24  Yes [provider]  Ferrous Sulfate (IRON PO)  08/18/23  Yes [provider]  gabapentin (NEURONTIN) 100 MG capsule Take 100 mg by mouth. 02/15/24  Yes [provider]  MIEBO 1.338 GM/ML SOLN APPLY 1 DROP INTO BOTH EYES FOUR TIMES A DAY 07/13/23  Yes [provider]  predniSONE (DELTASONE) 10 MG tablet Take 6 tabs p.o. on day 1 and decrease by 1 tablet daily until complete 10/01/24  Yes Arvis Jolan NOVAK, PA-C  Zinc Sulfate 66 (15 Zn) MG TABS 100 mg. 04/17/23  Yes [provider]  Acetaminophen -Codeine 300-30 MG tablet Take 1  tablet by mouth 2 (two) times daily. 08/10/20   [provider]  ascorbic acid (VITAMIN C) 1000 MG tablet Take by mouth.    [provider]  beta carotene 25000 UNIT capsule Take by mouth.    [provider]  Cetirizine HCl 10 MG CAPS Take by mouth. 06/06/11   [provider]  chlorpheniramine-HYDROcodone  (TUSSIONEX) 10-8 MG/5ML hydrocodone   10 mg-chlorpheniramine 8 mg/5 mL oral susp extend.rel 12hr    [provider]  Cholecalciferol 125 MCG (5000 UT) TABS Take by mouth.    [provider]  DULoxetine (CYMBALTA) 30 MG capsule Take 30 mg by mouth 2 (two) times daily.    [provider]  HYDROcodone -acetaminophen  (NORCO) 7.5-325 MG tablet Take 1 tablet by mouth every 6 (six) hours as needed for moderate pain. 04/08/22   Rodriguez-Southworth, Sylvia, PA-C  ibuprofen (ADVIL) 800 MG tablet Take by mouth.    [provider]  Melatonin 10 MG TABS Take 1 tablet by mouth at bedtime.    [provider]  Multiple Vitamin (MULTI-VITAMINS) TABS Take 1 tablet by mouth daily.    [provider]  naltrexone (DEPADE) 50 MG tablet Take 50 mg by mouth daily.    [provider]  omeprazole (PRILOSEC) 20 MG capsule Take 20 mg by mouth daily.    [provider]  sertraline (ZOLOFT) 50 MG tablet sertraline 50 mg tablet 09/07/20   [provider]  zinc gluconate 50 MG tablet Take by mouth.    [provider]    Family History Family History  Problem Relation Age of Onset   Diverticulosis Mother    Hypertension Father    Diabetes Father     Social History Social History   Tobacco Use   Smoking status: Never    Passive exposure: Never   Smokeless tobacco: Never  Vaping Use   Vaping status: Never Used  Substance Use Topics   Alcohol use: Yes    Comment: rare   Drug use: No     Allergies   Penicillins, Sulfa antibiotics, Sulfasalazine, Cephalosporins, Corn oil, Sulfanilamide, and Latex   Review of Systems Review of Systems  Constitutional:  Negative for chills, diaphoresis, fatigue and fever.  HENT:  Positive for congestion, ear pain and hearing loss. Negative for ear discharge, rhinorrhea, sinus pain and sore throat.   Respiratory:  Negative for cough.   Gastrointestinal:  Negative for nausea and vomiting.  Musculoskeletal:  Negative for  myalgias.  Skin:  Negative for rash.  Allergic/Immunologic: Positive for environmental allergies.  Neurological:  Negative for dizziness, weakness and headaches.  Hematological:  Negative for adenopathy.     Physical Exam Triage Vital Signs ED Triage Vitals [10/01/24 1344]  Encounter Vitals Group     BP      Girls Systolic BP Percentile      Girls Diastolic BP Percentile      Boys Systolic BP Percentile      Boys Diastolic BP Percentile      Pulse      Resp      Temp      Temp src      SpO2      Weight      Height      Head Circumference      Peak Flow      Pain Score 4     Pain Loc      Pain Education      Exclude from Growth Chart  No data found.  Updated Vital Signs BP 132/83 (BP Location: Left Arm)   Pulse 79   Temp 98.2 F (36.8 C) (Oral)   Resp 18   Wt 145 lb (65.8 kg)   SpO2 99%   BMI 25.69 kg/m       Physical Exam Vitals and nursing note reviewed.  Constitutional:      General: She is not in acute distress.    Appearance: Normal appearance. She is not ill-appearing or toxic-appearing.  HENT:     Head: Normocephalic and atraumatic.     Right Ear: Ear canal and external ear normal. A middle ear effusion (small) is present.     Left Ear: Tympanic membrane, ear canal and external ear normal.     Nose: Nose normal.     Mouth/Throat:     Mouth: Mucous membranes are moist.     Pharynx: Oropharynx is clear.  Eyes:     General: No scleral icterus.       Right eye: No discharge.        Left eye: No discharge.     Conjunctiva/sclera: Conjunctivae normal.  Cardiovascular:     Rate and Rhythm: Normal rate and regular rhythm.     Heart sounds: Normal heart sounds.  Pulmonary:     Effort: Pulmonary effort is normal. No respiratory distress.     Breath sounds: Normal breath sounds.  Musculoskeletal:     Cervical back: Neck supple.  Skin:    General: Skin is dry.  Neurological:     General: No focal deficit present.     Mental Status: She is alert.  Mental status is at baseline.     Motor: No weakness.     Gait: Gait normal.  Psychiatric:        Mood and Affect: Mood normal.        Behavior: Behavior normal.      UC Treatments / Results  Labs (all labs ordered are listed, but only abnormal results are displayed) Labs Reviewed - No data to display  EKG   Radiology No results found.  Procedures Procedures (including critical care time)  Medications Ordered in UC Medications - No data to display  Initial Impression / Assessment and Plan / UC Course  I have reviewed the triage vital signs and the nursing notes.  Pertinent labs & imaging results that were available during my care of the patient were reviewed by me and considered in my medical decision making (see chart for details).   63 year old female presents for right ear pain, fullness, reduced hearing and tinnitus for the past few days.  Has congestion related to allergies.  Has tried decongestants without any significant relief.  Presentation consistent with eustachian tube dysfunction.  Sent prednisone.  Advised to continue antihistamines/decongestants and nasal sprays, rest and fluids.  Reviewed return precautions.   Final Clinical Impressions(s) / UC Diagnoses   Final diagnoses:  Eustachian tube dysfunction, right  Otalgia of right ear     Discharge Instructions      - No infection or earwax buildup. - You have eustachian tube dysfunction.  Take decongestants/antihistamines and use nasal sprays like Flonase. - Start prednisone. - Return if fever or worsening discomfort.     ED Prescriptions     Medication Sig Dispense Auth. Provider   predniSONE (DELTASONE) 10 MG tablet Take 6 tabs p.o. on day 1 and decrease by 1 tablet daily until complete 21 tablet Arvis Jolan NOVAK, PA-C  PDMP not reviewed this encounter.   Arvis Jolan NOVAK, PA-C 10/01/24 1421

## 2024-10-01 NOTE — Discharge Instructions (Addendum)
-   No infection or earwax buildup. - You have eustachian tube dysfunction.  Take decongestants/antihistamines and use nasal sprays like Flonase. - Start prednisone. - Return if fever or worsening discomfort.

## 2024-10-01 NOTE — ED Triage Notes (Signed)
 Pt presents c/o R otalgia x 4 days. Pt states at first it was just pressure but now it's hurting.  Pt denies fever and/or throat pain.
